# Patient Record
Sex: Male | Born: 1980 | Race: Black or African American | Hispanic: No | Marital: Single | Smoking: Current every day smoker
Health system: Southern US, Community
[De-identification: ages and names within clinical notes are randomized; demographics above are authoritative.]

## PROBLEM LIST (undated history)

## (undated) DIAGNOSIS — E119 Type 2 diabetes mellitus without complications: Secondary | ICD-10-CM

## (undated) DIAGNOSIS — G4733 Obstructive sleep apnea (adult) (pediatric): Secondary | ICD-10-CM

## (undated) DIAGNOSIS — Z9989 Dependence on other enabling machines and devices: Secondary | ICD-10-CM

## (undated) DIAGNOSIS — E1110 Type 2 diabetes mellitus with ketoacidosis without coma: Secondary | ICD-10-CM

## (undated) DIAGNOSIS — K219 Gastro-esophageal reflux disease without esophagitis: Secondary | ICD-10-CM

## (undated) HISTORY — DX: Dependence on other enabling machines and devices: Z99.89

## (undated) HISTORY — PX: FOOT SURGERY: SHX648

## (undated) HISTORY — DX: Type 2 diabetes mellitus without complications: E11.9

## (undated) HISTORY — DX: Obstructive sleep apnea (adult) (pediatric): G47.33

## (undated) HISTORY — DX: Type 2 diabetes mellitus with ketoacidosis without coma: E11.10

## (undated) HISTORY — PX: HERNIA REPAIR: SHX51

---

## 2002-10-03 ENCOUNTER — Emergency Department (HOSPITAL_COMMUNITY): Admission: EM | Admit: 2002-10-03 | Discharge: 2002-10-04 | Payer: Self-pay | Admitting: Emergency Medicine

## 2002-10-03 ENCOUNTER — Encounter: Payer: Self-pay | Admitting: Emergency Medicine

## 2010-02-03 ENCOUNTER — Emergency Department (HOSPITAL_COMMUNITY): Admission: EM | Admit: 2010-02-03 | Discharge: 2010-02-03 | Payer: Self-pay | Admitting: Emergency Medicine

## 2010-02-10 ENCOUNTER — Emergency Department (HOSPITAL_COMMUNITY): Admission: EM | Admit: 2010-02-10 | Discharge: 2010-02-10 | Payer: Self-pay | Admitting: Emergency Medicine

## 2010-07-09 LAB — URINALYSIS, ROUTINE W REFLEX MICROSCOPIC
Bilirubin Urine: NEGATIVE
Glucose, UA: NEGATIVE mg/dL
Hgb urine dipstick: NEGATIVE
Ketones, ur: NEGATIVE mg/dL
Nitrite: NEGATIVE
Protein, ur: NEGATIVE mg/dL
Specific Gravity, Urine: 1.018 (ref 1.005–1.030)
Urobilinogen, UA: 0.2 mg/dL (ref 0.0–1.0)
pH: 6 (ref 5.0–8.0)

## 2010-07-09 LAB — URINE MICROSCOPIC-ADD ON

## 2010-07-10 LAB — URINE CULTURE
Colony Count: 75000
Culture  Setup Time: 201110110437

## 2010-07-10 LAB — URINE MICROSCOPIC-ADD ON

## 2010-07-10 LAB — URINALYSIS, ROUTINE W REFLEX MICROSCOPIC
Glucose, UA: NEGATIVE mg/dL
Hgb urine dipstick: NEGATIVE
Ketones, ur: NEGATIVE mg/dL
Nitrite: NEGATIVE
Protein, ur: NEGATIVE mg/dL
Specific Gravity, Urine: 1.038 — ABNORMAL HIGH (ref 1.005–1.030)
Urobilinogen, UA: 1 mg/dL (ref 0.0–1.0)
pH: 6 (ref 5.0–8.0)

## 2012-03-21 ENCOUNTER — Emergency Department (HOSPITAL_COMMUNITY)
Admission: EM | Admit: 2012-03-21 | Discharge: 2012-03-21 | Disposition: A | Discharge status: Home / Self Care | Payer: Self-pay | Attending: Emergency Medicine | Admitting: Emergency Medicine

## 2012-03-21 ENCOUNTER — Encounter (HOSPITAL_COMMUNITY): Payer: Self-pay | Admitting: Emergency Medicine

## 2012-03-21 ENCOUNTER — Emergency Department (HOSPITAL_COMMUNITY): Payer: Self-pay

## 2012-03-21 DIAGNOSIS — R110 Nausea: Secondary | ICD-10-CM | POA: Insufficient documentation

## 2012-03-21 DIAGNOSIS — R209 Unspecified disturbances of skin sensation: Secondary | ICD-10-CM | POA: Insufficient documentation

## 2012-03-21 DIAGNOSIS — M2550 Pain in unspecified joint: Secondary | ICD-10-CM | POA: Insufficient documentation

## 2012-03-21 DIAGNOSIS — R0602 Shortness of breath: Secondary | ICD-10-CM | POA: Insufficient documentation

## 2012-03-21 DIAGNOSIS — M549 Dorsalgia, unspecified: Secondary | ICD-10-CM | POA: Insufficient documentation

## 2012-03-21 DIAGNOSIS — R1013 Epigastric pain: Secondary | ICD-10-CM | POA: Insufficient documentation

## 2012-03-21 DIAGNOSIS — R5383 Other fatigue: Secondary | ICD-10-CM | POA: Insufficient documentation

## 2012-03-21 DIAGNOSIS — F17200 Nicotine dependence, unspecified, uncomplicated: Secondary | ICD-10-CM | POA: Insufficient documentation

## 2012-03-21 DIAGNOSIS — R61 Generalized hyperhidrosis: Secondary | ICD-10-CM | POA: Insufficient documentation

## 2012-03-21 DIAGNOSIS — R5381 Other malaise: Secondary | ICD-10-CM | POA: Insufficient documentation

## 2012-03-21 DIAGNOSIS — R0789 Other chest pain: Secondary | ICD-10-CM | POA: Insufficient documentation

## 2012-03-21 LAB — BASIC METABOLIC PANEL
CO2: 25 mEq/L (ref 19–32)
Calcium: 9.1 mg/dL (ref 8.4–10.5)
Chloride: 100 mEq/L (ref 96–112)
Creatinine, Ser: 1.07 mg/dL (ref 0.50–1.35)
GFR calc Af Amer: >90 mL/min (ref >90)
Glucose, Bld: 194 mg/dL — ABNORMAL HIGH (ref 70–99)
Potassium: 4.2 mEq/L (ref 3.5–5.1)
Sodium: 137 mEq/L (ref 135–145)

## 2012-03-21 LAB — CBC WITH DIFFERENTIAL/PLATELET
Basophils Absolute: 0 10*3/uL (ref 0.0–0.1)
Basophils Relative: 0 % (ref 0–1)
Eosinophils Absolute: 0.1 10*3/uL (ref 0.0–0.7)
Hemoglobin: 15.5 g/dL (ref 13.0–17.0)
Lymphocytes Relative: 36 % (ref 12–46)
Lymphs Abs: 3 10*3/uL (ref 0.7–4.0)
MCH: 28.4 pg (ref 26.0–34.0)
MCHC: 33.2 g/dL (ref 30.0–36.0)
MCV: 85.5 fL (ref 78.0–100.0)
Monocytes Absolute: 0.4 10*3/uL (ref 0.1–1.0)
Neutro Abs: 4.8 10*3/uL (ref 1.7–7.7)
Neutrophils Relative %: 58 % (ref 43–77)
Platelets: 285 10*3/uL (ref 150–400)
RBC: 5.46 MIL/uL (ref 4.22–5.81)
RDW: 13.1 % (ref 11.5–15.5)
WBC: 8.3 10*3/uL (ref 4.0–10.5)

## 2012-03-21 LAB — POCT I-STAT TROPONIN I
Troponin i, poc: 0 ng/mL (ref 0.00–0.08)
Troponin i, poc: 0 ng/mL (ref 0.00–0.08)

## 2012-03-21 MED ORDER — IBUPROFEN 200 MG PO TABS
800.0000 mg | ORAL_TABLET | Freq: Once | ORAL | Status: AC
  Administered 2012-03-21: 800 mg via ORAL
  Filled 2012-03-21: qty 4

## 2012-03-21 MED ORDER — GI COCKTAIL ~~LOC~~
30.0000 mL | Freq: Once | ORAL | Status: AC
  Administered 2012-03-21: 30 mL via ORAL
  Filled 2012-03-21: qty 30

## 2012-03-21 NOTE — ED Notes (Signed)
Pt c/o midsternal CP with SOB; pt has pain worse with inspiration

## 2012-03-21 NOTE — ED Notes (Addendum)
Pt c/o 8/10 intermittent tightness to central chest onset 4 days ago. Pt took tylenol yesterday with no relief. Pt A&Ox4, ambulatory, VSS. Pt states the cp woke him up out of sleep last night, c/o diaphoresis. Pt states he recently has fallen asleep randomly at work, in the car, when talking to friends.

## 2012-03-21 NOTE — ED Provider Notes (Signed)
History     CSN: [id number]  Arrival date & time 03/21/12  1637   First MD Initiated Contact with Patient 03/21/12 1843      Chief Complaint  Patient presents with  . Chest Pain    (Consider location/radiation/quality/duration/timing/severity/associated sxs/prior treatment) Patient is a 31 y.o. male presenting with chest pain. The history is provided by the patient. No language interpreter was used.  Chest Pain The chest pain began 3 - 5 days ago. Chest pain occurs intermittently. The chest pain is worsening. At its most intense, the pain is at 8/10. The pain is currently at 0/10. The quality of the pain is described as pressure-like. The pain radiates to the left jaw. Primary symptoms include shortness of breath and nausea. Pertinent negatives for primary symptoms include no fever, no syncope, no cough, no abdominal pain, no vomiting and no dizziness.  Associated symptoms include diaphoresis and weakness.  Pertinent negatives for associated symptoms include no near-syncope and no numbness. Risk factors include smoking/tobacco exposure, male gender, lack of exercise and obesity.  His past medical history is significant for sleep apnea.  Pertinent negatives for past medical history include no aneurysm, no CHF, no DVT, no hypertension, no MI, no pacemaker, no PE and no seizures.  His family medical history is significant for diabetes in family, hyperlipidemia in family and hypertension in family.  Pertinent negatives for family medical history include: no early MI in family and no PE in family.  Procedure history is negative for cardiac catheterization, echocardiogram, persantine thallium, stress echo, stress thallium and exercise treadmill test.    31 year old male coming in with complaint of chest pain that started 4 days ago and is intermittent and lasts for minutes to hours. Multiple other complaints such as a head injury when he was young and states that since then he falls asleep  spontaneously.  For this reason he does not drive.  Also c/o numbness in his hands intermittantly.  States that last night the chest pain woke him up in the middle of his sleep. Says the pain is worse with exertion and better with rest. Describes the pain as tightness 7/10.  He took tylenol for pain with no relief.  Patient does not know if he has high cholesterol but he had a physical last year and he did not have high cholesterol, hypertension or diabetes.    Patient states that he ids a smoker.  No family hx of MI but CHF.  Also c/o chronic back pain.  He goes to [facility] when he has insurance.    History reviewed. No pertinent past medical history.  History reviewed. No pertinent past surgical history.  History reviewed. No pertinent family history.  History  Substance Use Topics  . Smoking status: Current Every Day Smoker  . Smokeless tobacco: Not on file  . Alcohol Use: No      Review of Systems  Constitutional: Positive for diaphoresis. Negative for fever.  HENT: Negative.   Eyes: Negative.   Respiratory: Positive for shortness of breath. Negative for cough.   Cardiovascular: Positive for chest pain. Negative for syncope and near-syncope.       R chest pain  Gastrointestinal: Positive for nausea. Negative for vomiting and abdominal pain.  Musculoskeletal: Positive for back pain and arthralgias. Negative for gait problem.  Neurological: Positive for weakness. Negative for dizziness, seizures and numbness.  Psychiatric/Behavioral: Negative.  Negative for confusion. The patient is not nervous/anxious.   All other systems reviewed and are negative.  Allergies  Review of patient's allergies indicates no known allergies.  Home Medications  No current outpatient prescriptions on file.  BP 125/83  Pulse 83  Resp 18  SpO2 99%  Physical Exam  Nursing note and vitals reviewed. Constitutional: He is oriented to person, place, and time. He appears well-developed and  well-nourished.  HENT:  Head: Normocephalic.  Eyes: Conjunctivae normal and EOM are normal. Pupils are equal, round, and reactive to light.  Neck: Normal range of motion. Neck supple.  Cardiovascular: Normal rate.   Pulmonary/Chest: Breath sounds normal. He is in respiratory distress.  Abdominal: Soft. Bowel sounds are normal. He exhibits no distension. There is no tenderness.  Musculoskeletal: Normal range of motion.  Neurological: He is alert and oriented to person, place, and time.  Skin: Skin is warm and dry.  Psychiatric: He has a normal mood and affect.    ED Course  Procedures (including critical care time)  Labs Reviewed  BASIC METABOLIC PANEL - Abnormal; Notable for the following:    Glucose, Bld 194 (*)     GFR calc non Af Amer 90 (*)     All other components within normal limits  CBC WITH DIFFERENTIAL  POCT I-STAT TROPONIN I   Dg Chest 2 View  03/21/2012  *RADIOLOGY REPORT*  Clinical Data: Right-sided chest pain and shortness of breath.  CHEST - 2 VIEW  Comparison: No priors.  Findings: Lung volumes are normal.  No consolidative airspace disease.  No pleural effusions.  No pneumothorax.  No pulmonary nodule or mass noted.  Pulmonary vasculature and the cardiomediastinal silhouette are within normal limits.  IMPRESSION: 1. No radiographic evidence of acute cardiopulmonary disease.   Original Report Authenticated By: Trudie Reed, M.D.      No diagnosis found.    MDM  Atypical chest pain to R sternal border x 4 days intermittant.  Better in ER after GI cocktail.  Reproducible.  Will follow up with cardiology tomorrow.  Also he will get a pcp to follow his blood sugars which were elevated in the ER.  Chest x-ray unremarkable and reviewed by myself.  Cardiac markers negative x 2.     Labs Reviewed  BASIC METABOLIC PANEL - Abnormal; Notable for the following:    Glucose, Bld 194 (*)     GFR calc non Af Amer 90 (*)     All other components within normal limits  CBC  WITH DIFFERENTIAL  POCT I-STAT TROPONIN I  POCT I-STAT TROPONIN I      Date: 03/21/2012  Rate: 91  Rhythm: normal sinus rhythm  QRS Axis: right  Intervals: normal  ST/T Wave abnormalities: normal  Conduction Disutrbances:none  Narrative Interpretation:   Old EKG Reviewed: none available      Remi Haggard, NP 03/21/12 2137

## 2012-03-23 NOTE — ED Provider Notes (Signed)
Medical screening examination/treatment/procedure(s) were conducted as a shared visit with non-physician practitioner(s) and myself.  I personally evaluated the patient during the encounter Pt with cp for past 3-4 days, constant. Chest wall tenderness. Ed workup neg.   Suzi Roots, MD 03/23/12 [phone]

## 2012-04-14 ENCOUNTER — Ambulatory Visit: Payer: Self-pay

## 2012-04-14 ENCOUNTER — Encounter: Payer: Self-pay | Admitting: Radiation Oncology

## 2012-04-14 ENCOUNTER — Ambulatory Visit (INDEPENDENT_AMBULATORY_CARE_PROVIDER_SITE_OTHER): Payer: Self-pay | Admitting: Radiation Oncology

## 2012-04-14 VITALS — BP 124/80 | HR 95 | Temp 98.5°F | Resp 20 | Ht 70.5 in | Wt 307.3 lb

## 2012-04-14 DIAGNOSIS — R799 Abnormal finding of blood chemistry, unspecified: Secondary | ICD-10-CM

## 2012-04-14 DIAGNOSIS — R7989 Other specified abnormal findings of blood chemistry: Secondary | ICD-10-CM

## 2012-04-14 DIAGNOSIS — Z79899 Other long term (current) drug therapy: Secondary | ICD-10-CM

## 2012-04-14 DIAGNOSIS — E119 Type 2 diabetes mellitus without complications: Secondary | ICD-10-CM

## 2012-04-14 DIAGNOSIS — R739 Hyperglycemia, unspecified: Secondary | ICD-10-CM

## 2012-04-14 LAB — BASIC METABOLIC PANEL
BUN: 15 mg/dL (ref 6–23)
CO2: 27 mEq/L (ref 19–32)
Calcium: 9.2 mg/dL (ref 8.4–10.5)
Chloride: 102 mEq/L (ref 96–112)
Creat: 1.39 mg/dL — ABNORMAL HIGH (ref 0.50–1.35)
Glucose, Bld: 78 mg/dL (ref 70–99)
Potassium: 4.2 mEq/L (ref 3.5–5.3)
Sodium: 138 mEq/L (ref 135–145)

## 2012-04-14 LAB — POCT GLYCOSYLATED HEMOGLOBIN (HGB A1C): Hemoglobin A1C: 7.3

## 2012-04-14 LAB — GLUCOSE, CAPILLARY: Glucose-Capillary: 78 mg/dL (ref 70–99)

## 2012-04-14 MED ORDER — METFORMIN HCL 500 MG PO TABS: ORAL_TABLET | ORAL | Status: DC

## 2012-04-14 NOTE — Patient Instructions (Signed)
Type 2 Diabetes       Diabetes is a long-lasting (chronic) disease. One or both of the following happen with type 2 diabetes:   The pancreas does not make enough of a hormone called insulin.   The body has trouble using the insulin that is made.  HOME CARE   Check your blood sugar (glucose) once a day, or as told by your doctor.   Take all medicine as told by your doctor.   Do not smoke.   Eat healthy foods. Weight loss can help your diabetes.   Learn about low blood sugar (hypoglycemia). Know how to treat it.   Get your eyes checked on a regular basis.   Get a physical exam every year. Get your blood pressure checked. Get your blood and pee (urine) tested.   Wear a necklace or bracelet that says you have diabetes.   Check your feet every night for cuts, sores, blisters, and redness. Tell your doctor if you have problems.  GET HELP RIGHT AWAY IF:   You have trouble keeping your blood sugar in target range.   You have problems with your medicines.   You are sick and not getting better after 24 hours.   You have a sore or wound that is not healing.   You have vision problems or changes.   You have a fever.  MAKE SURE YOU:   Understand these instructions.   Will watch your condition.   Will get help right away if you are not doing well or get worse.  Document Released: 01/21/2008 Document Revised: 07/06/2011 Document Reviewed: 09/29/2010   [employer]® Patient Information ©2013 [employer].

## 2012-04-15 DIAGNOSIS — E119 Type 2 diabetes mellitus without complications: Secondary | ICD-10-CM | POA: Insufficient documentation

## 2012-04-15 DIAGNOSIS — R7989 Other specified abnormal findings of blood chemistry: Secondary | ICD-10-CM | POA: Insufficient documentation

## 2012-04-15 DIAGNOSIS — IMO0002 Reserved for concepts with insufficient information to code with codable children: Secondary | ICD-10-CM | POA: Insufficient documentation

## 2012-04-15 NOTE — Assessment & Plan Note (Addendum)
Pt has diabetes mellitus, as evidenced by his multiple elevated CBGs and his A1c today. He will start metformin at this time, which will be titrated up over one month. He will return to clinic in approximately one month for followup and modification of his medication regimen as necessary. - metformin 500mg  qAM titrated upwards to 1000mg  bid over 4 wks (see rx) - f/u in 1 month - f/u with diabetic educator to discuss lifestyle changes and weight reduction  Lab Results  Component Value Date   HGBA1C 7.3 04/14/2012

## 2012-04-15 NOTE — Progress Notes (Signed)
Subjective:    Patient ID: Brady Gibbs, male    DOB: 1980/09/12, 31 y.o.   MRN: [id number]  HPI This is a 31 year old man with no reported past medical history who presents to clinic today for evaluation of hyperglycemia, which was noted on a recent ED visit for chest pain. The patient states that he has been feeling tired and fatigued for approximately one year, and also complains of polyuria and polydipsia for that same period of time. He also states he believes he's gained approximately 60 pounds over the last year.   He denies having any further chest pain following his recent ED visit, at which time patient had a negative workup for cardiac causes of chest pain, and had chest wall tenderness to palpation. He complains of similar aches and pains in his right upper back, and of occasional mild bilateral cramping abdominal pain with bowel movements. He also complains of cramps in his bilateral thighs. 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 He denies any other complaints today.    Review of Systems  Current Outpatient Medications: Current Outpatient Prescriptions  Medication Sig Dispense Refill  . acetaminophen (TYLENOL) 500 MG tablet Take 1,000 mg by mouth every 6 (six) hours as needed. For pain      . metFORMIN (GLUCOPHAGE) 500 MG tablet 1 tab PO qAM x 7d, then 1 tab bid x 7d, then 2 tab qAM and 1 tab qPM x 7d, then 2 tabs bid x 7d  60 tablet  0    Allergies: No Known Allergies   Past Medical History: No past medical history on file.  Past Surgical History: No past surgical history on file.  Family History: No family history on file.  Social History: History   Social History  . Marital Status: Married    Spouse Name: N/A    Number of Children: N/A  . Years of Education: N/A   Occupational History  . Not on file.   Social History Main Topics  . Smoking  status: Current Every Day Smoker -- 0.5 packs/day    Types: Cigarettes  . Smokeless tobacco: Not on file     Comment: Quit line  . Alcohol Use: No  . Drug Use: No  . Sexually Active: Not on file   Other Topics Concern  . Not on file   Social History Narrative  . No narrative on file     Vital Signs: Blood pressure 124/80, pulse 95, temperature 98.5 F (36.9 C), temperature source Oral, resp. rate 20, height 5' 10.5" (1.791 m), weight 307 lb 4.8 oz (139.39 kg), SpO2 96.00%.      Objective:   Physical Exam  Constitutional: He is oriented to person, place, and time. He appears well-developed and well-nourished. No distress.  HENT:  Head: Normocephalic and atraumatic.  Eyes: Pupils are equal, round, and reactive to light. No scleral icterus.  Neck: Normal range of motion. Neck supple. No tracheal deviation present.  Cardiovascular: Normal rate and regular rhythm.   No murmur heard. Pulmonary/Chest: Effort normal. He has no wheezes. He has no rales.  Abdominal: Soft. Bowel sounds are normal. He exhibits no distension. There is no tenderness.  Musculoskeletal: Normal range of motion. He exhibits no edema.       Arms: Neurological: He is alert and oriented to person, place, and time.  Skin: Skin is warm and dry. No erythema.  Psychiatric: He has a normal mood and affect. His behavior is normal.          Assessment & Plan:

## 2012-04-15 NOTE — Assessment & Plan Note (Signed)
BMET results (obtained after visit) reveal pt's creatinine is somewhat increased since his ED visit. Suspect this is 2/2 to patient's new job which requires significant exertion, however he will need to RTC for repeat BMET in 1wk given that he was started on metformin today.  - repeat BMET in 1 wk   BMET    Component Value Date/Time   NA 138 04/14/2012 1637   K 4.2 04/14/2012 1637   CL 102 04/14/2012 1637   CO2 27 04/14/2012 1637   GLUCOSE 78 04/14/2012 1637   BUN 15 04/14/2012 1637   CREATININE 1.39* 04/14/2012 1637   CREATININE 1.07 03/21/2012 1658   CALCIUM 9.2 04/14/2012 1637   GFRNONAA 90* 03/21/2012 1658   GFRAA >90 03/21/2012 1658

## 2012-04-22 ENCOUNTER — Encounter: Payer: Self-pay | Admitting: Radiation Oncology

## 2012-04-25 ENCOUNTER — Encounter: Payer: Self-pay | Admitting: Radiation Oncology

## 2012-05-11 ENCOUNTER — Other Ambulatory Visit (INDEPENDENT_AMBULATORY_CARE_PROVIDER_SITE_OTHER): Payer: Self-pay

## 2012-05-11 DIAGNOSIS — R799 Abnormal finding of blood chemistry, unspecified: Secondary | ICD-10-CM

## 2012-05-11 LAB — BASIC METABOLIC PANEL
BUN: 13 mg/dL (ref 6–23)
CO2: 26 mEq/L (ref 19–32)
Calcium: 9.2 mg/dL (ref 8.4–10.5)
Chloride: 103 mEq/L (ref 96–112)
Creat: 1.27 mg/dL (ref 0.50–1.35)
Glucose, Bld: 169 mg/dL — ABNORMAL HIGH (ref 70–99)
Potassium: 4 mEq/L (ref 3.5–5.3)
Sodium: 139 mEq/L (ref 135–145)

## 2012-05-18 ENCOUNTER — Encounter: Payer: Self-pay | Admitting: Dietician

## 2012-05-18 ENCOUNTER — Ambulatory Visit: Payer: Self-pay

## 2012-05-19 ENCOUNTER — Encounter: Payer: Self-pay | Admitting: Dietician

## 2012-05-19 ENCOUNTER — Ambulatory Visit (INDEPENDENT_AMBULATORY_CARE_PROVIDER_SITE_OTHER): Payer: Self-pay | Admitting: Dietician

## 2012-05-19 ENCOUNTER — Encounter: Payer: Self-pay | Admitting: Internal Medicine

## 2012-05-19 VITALS — BP 130/90 | Ht 72.0 in | Wt 312.1 lb

## 2012-05-19 DIAGNOSIS — E119 Type 2 diabetes mellitus without complications: Secondary | ICD-10-CM

## 2012-05-19 NOTE — Progress Notes (Signed)
Diabetes Self-Management Training (DSMT)  Initial Visit  05/19/2012 Mr. Brady Gibbs, identified by name and date of birth, is a 32 y.o. male with Type 2 Diabetes. Year of diabetes diagnosis: 04/2012  ASSESSMENT Patient concerns are Nutrition/meal planning, Monitoring, Healthy Lifestyle, Glycemic control and Weight control.  Blood pressure 130/90, height 6' (1.829 m), weight 312 lb 1.6 oz (141.568 kg). Body mass index is 42.33 kg/(m^2). No results found for this basename: [facility]   Lab Results  Component Value Date   HGBA1C 7.3 04/14/2012   Family history of diabetes: Yes Patients belief/attitude about diabetes: Diabetes can be controlled. Self foot exams daily: No Diabetes Complications: {DM COMPLICATIONS:2029 Support systems: family- mother and brother with whom he lives Special needs: None Prior DM Education: No   Medications See Medications list.  Is interested in learning more   Exercise Plan Doing ADLs and walking for 30 minutesa day.   Self-Monitoring Waiting on orange card to get meter from Health department Lunch: 74 here in office after skipping lunch and eating muffin and egg sandwich for breakfast with water Hyperglycemia: Yes Rarely Hypoglycemia: No   Meal Planning Some knowledge and Interested in improving   Assessment comments: patient caught in very quickly to meal planning- has good base knowledge of nutrition. Motivated to learn.    INDIVIDUAL DIABETES EDUCATION PLAN:  Diabetes disease state Nutrition management Physical activity and exercise Medication Monitoring Acute complication: Chronic complications Goal setting _______________________________________________________________________  Intervention TOPICS COVERED TODAY:  Nutrition management  Role of diet in the treatment of diabetes and the relationship between the three main macronutritents and blood glucose control. Monitoring  Purpose and frequency of SMBG. Goal setting  Lifestyle  issues that need to be addressed for better diabetes care Review risk of smoking and offered smoking cessation  PATIENTS GOALS/PLAN (copy and paste in patient instructions so patient receives a copy): 1.  Learning Objective:       State important issues of diabetes care 2.  Behavioral Objective:         Nutrition: To improve blood glucose control I will follow meal plan of 4-6 servings 60-90 grams carb/meal and ~ 30 for snacks Never 0%  Personalized Follow-Up Plan for Ongoing Self Management Support:  Doctor's Office, family and CDE visits ______________________________________________________________________   Outcomes Expected outcomes: Demonstrated interest in learning.Expect positive changes in lifestyle. Self-care Barriers: Lack of material resources, Coping skills Education material provided: yes- living well with diabetes Patient to contact team via Phone if problems or questions. Time in: 1500     Time out: 1600 Future DSMT - 3-4 wks or sooner if patient desires- he agreed to come back once he gets his beter and also to follow up on meal planning reassessment of needs  Bunnie Lederman, Lupita Leash

## 2012-05-23 ENCOUNTER — Encounter: Payer: Self-pay | Admitting: Internal Medicine

## 2012-05-23 ENCOUNTER — Encounter: Payer: Self-pay | Admitting: Dietician

## 2012-05-23 ENCOUNTER — Ambulatory Visit (INDEPENDENT_AMBULATORY_CARE_PROVIDER_SITE_OTHER): Payer: Self-pay | Admitting: Internal Medicine

## 2012-05-23 VITALS — BP 132/98 | HR 104 | Temp 97.8°F | Ht 70.0 in | Wt 312.4 lb

## 2012-05-23 DIAGNOSIS — E119 Type 2 diabetes mellitus without complications: Secondary | ICD-10-CM

## 2012-05-23 DIAGNOSIS — R358 Other polyuria: Secondary | ICD-10-CM

## 2012-05-23 DIAGNOSIS — N390 Urinary tract infection, site not specified: Secondary | ICD-10-CM

## 2012-05-23 LAB — BASIC METABOLIC PANEL
CO2: 23 mEq/L (ref 19–32)
Chloride: 104 mEq/L (ref 96–112)
Creat: 1.17 mg/dL (ref 0.50–1.35)
Glucose, Bld: 138 mg/dL — ABNORMAL HIGH (ref 70–99)

## 2012-05-23 LAB — GLUCOSE, CAPILLARY: Glucose-Capillary: 173 mg/dL — ABNORMAL HIGH (ref 70–99)

## 2012-05-23 MED ORDER — SULFAMETHOXAZOLE-TRIMETHOPRIM 800-160 MG PO TABS: 1.0000 | ORAL_TABLET | Freq: Two times a day (BID) | ORAL | Status: AC

## 2012-05-23 NOTE — Progress Notes (Signed)
  Subjective:   Patient ID: Brady Gibbs male   DOB: 15-Oct-1980 32 y.o.   MRN: [id number]  HPI: Brady Gibbs is a 31 y.o. man pmh DM type 2 presenting to clinic for acute symptoms of polyuria. Patient has had symptoms of polyuria and polydipsia for about the past 2-3 days. No associated fever or chills, back pain, abdominal pain, discharge, new sexual partners, or hematuria. She has not uptitrated his metformin yet at this time and is only taking 500 mg daily for fear that increasing would increase her potential cause some abdominal pain. Patient describes some sort of neonatal surgery regarding his abdomen and has a transverse scar. Patient has had UTIs in the past once about 2 years ago that required treatment and is unsure whether he had as a child. Patient has had no sexual contact for about 2 years, no associated scrotal pain, or testicular masses. The patient did describe a sudden direction of a singular lesion on his right scrotum.    No past medical history on file. Current Outpatient Prescriptions  Medication Sig Dispense Refill  . acetaminophen (TYLENOL) 500 MG tablet Take 1,000 mg by mouth every 6 (six) hours as needed. For pain      . metFORMIN (GLUCOPHAGE) 500 MG tablet 1 tab PO qAM x 7d, then 1 tab bid x 7d, then 2 tab qAM and 1 tab qPM x 7d, then 2 tabs bid x 7d  60 tablet  0  . sulfamethoxazole-trimethoprim (BACTRIM DS) 800-160 MG per tablet Take 1 tablet by mouth 2 (two) times daily.  28 tablet  0   No family history on file. History   Social History  . Marital Status: Married    Spouse Name: N/A    Number of Children: N/A  . Years of Education: N/A   Social History Main Topics  . Smoking status: Current Every Day Smoker -- 0.5 packs/day    Types: Cigarettes  . Smokeless tobacco: None     Comment: Quit line  . Alcohol Use: No  . Drug Use: No  . Sexually Active: None   Other Topics Concern  . None   Social History Narrative  . None   Review of Systems: otherwise  negative unless listed in HPI  Objective:  Physical Exam: Filed Vitals:   05/23/12 1442  BP: 132/98  Pulse: 104  Temp: 97.8 F (36.6 C)  TempSrc: Oral  Height: 5\' 10"  (1.778 m)  Weight: 312 lb 6.4 oz (141.704 kg)  SpO2: 96%   General: NAD HEENT: PERRL, EOMI, no scleral icterus Cardiac: RRR, no rubs, murmurs or gallops Pulm: clear to auscultation bilaterally, moving normal volumes of air Abd: soft, nontender, nondistended, BS present Ext: warm and well perfused, no pedal edema, penis circumcised, singular lesion on right scrotum pedunculated non-erythmeatous, nontender, no testicular masses, both testicles of equal size and shape, no LAD, no expressed penile discharge Neuro: alert and oriented X3, cranial nerves II-XII grossly intact  Assessment & Plan:  1. Complicated UTI: Patient UA positive for RBCs and leukocytes but nitrate negative. Patient has had UTI in the past and described polyuria. Some concern for anatomical pathology given neonatal surgery and second symptomatic UTI. -UA -UCx -referral to urology -bactrim DS for 14 days -Bmet to check Cr as was previously elevated  2. DM: Patient new diagnosed diabetic hemoglobin A1c 7.3 -increase metformin 500mg  BID -cont diabetes education  Pt discussed with Dr. Criselda Peaches

## 2012-05-23 NOTE — Patient Instructions (Signed)
Was nice to meet you today.  You do have a urinary tract infection and will be treating that with an antibiotic called Bactrim DS please take it 2 times a day for 14 days and complete all of the we also made a referral for you to see a urologist since this is your second symptomatic infection.  In terms of your diabetes please continue to increase the metformin as written on your prescription and followup in one month to see how you're doing. Continue seeing diabetes educator.  Do not hesitate to contact our clinic with any more questions

## 2012-05-24 LAB — URINALYSIS, ROUTINE W REFLEX MICROSCOPIC
Hgb urine dipstick: NEGATIVE
Ketones, ur: NEGATIVE mg/dL
Nitrite: NEGATIVE
Protein, ur: NEGATIVE mg/dL
Specific Gravity, Urine: >1.03 — ABNORMAL HIGH (ref 1.005–1.030)
Urobilinogen, UA: 0.2 mg/dL (ref 0.0–1.0)

## 2012-05-24 LAB — URINALYSIS, MICROSCOPIC ONLY
Bacteria, UA: NONE SEEN
Casts: NONE SEEN

## 2012-05-24 LAB — URINE CULTURE: Colony Count: NO GROWTH

## 2012-06-14 ENCOUNTER — Ambulatory Visit (INDEPENDENT_AMBULATORY_CARE_PROVIDER_SITE_OTHER): Payer: No Typology Code available for payment source | Admitting: Radiation Oncology

## 2012-06-14 ENCOUNTER — Encounter: Payer: Self-pay | Admitting: Radiation Oncology

## 2012-06-14 VITALS — BP 138/88 | HR 104 | Temp 98.0°F | Ht 71.0 in | Wt 314.5 lb

## 2012-06-14 DIAGNOSIS — G4710 Hypersomnia, unspecified: Secondary | ICD-10-CM

## 2012-06-14 DIAGNOSIS — E119 Type 2 diabetes mellitus without complications: Secondary | ICD-10-CM

## 2012-06-14 DIAGNOSIS — R400 Somnolence: Secondary | ICD-10-CM

## 2012-06-14 DIAGNOSIS — Z87440 Personal history of urinary (tract) infections: Secondary | ICD-10-CM

## 2012-06-14 LAB — GLUCOSE, CAPILLARY: Glucose-Capillary: 237 mg/dL — ABNORMAL HIGH (ref 70–99)

## 2012-06-14 MED ORDER — METFORMIN HCL 500 MG PO TABS: 1000.0000 mg | ORAL_TABLET | Freq: Two times a day (BID) | ORAL | Status: DC

## 2012-06-14 NOTE — Patient Instructions (Addendum)
Sleep Apnea  Sleep apnea is a sleep disorder characterized by abnormal pauses in breathing while you sleep. When your breathing pauses, the level of oxygen in your blood decreases. This causes you to move out of deep sleep and into light sleep. As a result, your quality of sleep is poor, and the system that carries your blood throughout your body (cardiovascular system) experiences stress. If sleep apnea remains untreated, the following conditions can develop:  High blood pressure (hypertension).  Coronary artery disease.  Inability to achieve or maintain an erection (impotence).  Impairment of your thought process (cognitive dysfunction). There are three types of sleep apnea: 1. Obstructive sleep apnea Pauses in breathing during sleep because of a blocked airway. 2. Central sleep apnea Pauses in breathing during sleep because the area of the brain that controls your breathing does not send the correct signals to the muscles that control breathing. 3. Mixed sleep apnea A combination of both obstructive and central sleep apnea. RISK FACTORS The following risk factors can increase your risk of developing sleep apnea:  Being overweight.  Smoking.  Having narrow passages in your nose and throat.  Being of older age.  Being male.  Alcohol use.  Sedative and tranquilizer use.  Ethnicity. Among individuals younger than 35 years, African Americans are at increased risk of sleep apnea. SYMPTOMS   Difficulty staying asleep.  Daytime sleepiness and fatigue.  Loss of energy.  Irritability.  Loud, heavy snoring.  Morning headaches.  Trouble concentrating.  Forgetfulness.  Decreased interest in sex. DIAGNOSIS  In order to diagnose sleep apnea, your caregiver will perform a physical examination. Your caregiver may suggest that you take a home sleep test. Your caregiver may also recommend that you spend the night in a sleep lab. In the sleep lab, several monitors record  information about your heart, lungs, and brain while you sleep. Your leg and arm movements and blood oxygen level are also recorded. TREATMENT The following actions may help to resolve mild sleep apnea:  Sleeping on your side.   Using a decongestant if you have nasal congestion.   Avoiding the use of depressants, including alcohol, sedatives, and narcotics.   Losing weight and modifying your diet if you are overweight. There also are devices and treatments to help open your airway:  Oral appliances. These are custom-made mouthpieces that shift your lower jaw forward and slightly open your bite. This opens your airway.  Devices that create positive airway pressure. This positive pressure "splints" your airway open to help you breathe better during sleep. The following devices create positive airway pressure:  Continuous positive airway pressure (CPAP) device. The CPAP device creates a continuous level of air pressure with an air pump. The air is delivered to your airway through a mask while you sleep. This continuous pressure keeps your airway open.  Nasal expiratory positive airway pressure (EPAP) device. The EPAP device creates positive air pressure as you exhale. The device consists of single-use valves, which are inserted into each nostril and held in place by adhesive. The valves create very little resistance when you inhale but create much more resistance when you exhale. That increased resistance creates the positive airway pressure. This positive pressure while you exhale keeps your airway open, making it easier to breath when you inhale again.  Bilevel positive airway pressure (BPAP) device. The BPAP device is used mainly in patients with central sleep apnea. This device is similar to the CPAP device because it also uses an air pump to deliver  continuous air pressure through a mask. However, with the BPAP machine, the pressure is set at two different levels. The pressure when you  exhale is lower than the pressure when you inhale.  Surgery. Typically, surgery is only done if you cannot comply with less invasive treatments or if the less invasive treatments do not improve your condition. Surgery involves removing excess tissue in your airway to create a wider passage way. Document Released: 04/03/2002 Document Revised: 10/13/2011 Document Reviewed: 08/20/2011 [facility] Patient Information 2013 [place], [place].

## 2012-06-15 ENCOUNTER — Encounter: Payer: Self-pay | Admitting: Radiation Oncology

## 2012-06-15 DIAGNOSIS — Z87440 Personal history of urinary (tract) infections: Secondary | ICD-10-CM | POA: Insufficient documentation

## 2012-06-15 DIAGNOSIS — G4733 Obstructive sleep apnea (adult) (pediatric): Secondary | ICD-10-CM | POA: Insufficient documentation

## 2012-06-15 LAB — MICROALBUMIN / CREATININE URINE RATIO: Microalb Creat Ratio: 12.2 mg/g (ref 0.0–30.0)

## 2012-06-15 NOTE — Assessment & Plan Note (Signed)
Final UA results from 05/23/2012 visit are not highly concerning for UTI, particularly as urine cultures received no growth. From the Epic records, it is unclear if the patient has ever had a UTI in the past (UAs in the past have been relatively benign). No urology referral to be made at this time, however will consider this option if patient has findings consistent with UTI in the future, as this would indeed be a concerning finding for him given his male gender and overall lack of identifiable predisposing risk factors.

## 2012-06-15 NOTE — Assessment & Plan Note (Signed)
The patient has been taking his metformin at a lower dose than prescribed (1000mg  AM and 500mg  PM versus 1000mg  bid as prescribed). He was instructed to take metformin 1000mg  bid going forward.  - RTC to recheck A1c (pt unfortunately left the office before lab was drawn) => will determine appropriate f/u interval depending on result

## 2012-06-15 NOTE — Assessment & Plan Note (Signed)
Patient's complaints of daytime somnolence and witnessed apneic episodes during sleep are consistent with OSA and warrant further investigation. - nocturnal polysomnography

## 2012-06-15 NOTE — Progress Notes (Signed)
Subjective:    Patient ID: Brady Gibbs, male    DOB: [birth year], 32 y.o.   MRN: [id number]  HPI Patient is a 32 yo man with PMH significant for newly diagnosed type 2 diabetes mellitus in 03/2012. He is here today for a follow-up visit after starting oral antihyperglycemic therapy. He states he has been taking metformin 1000mg  in the AM and 500mg  in the PM rather than 1000mg  bid (prescribed dose) as he was afraid he would run out of this medication prior to today's visit. Since his initial visit in 03/2012 when he was diagnosed with DM, he was seen acutely in the [facility] for complaints of back/flank pain on 05/23/2012. Urine dipstick had revealed Hgb and leukocytes, and the patient was treated for complicated UTI with a 14-day antibiotic course. He states he completed these antibiotics, and denies any recent dysuria or hematuria.    The patient also complains of apneic episodes during sleep, as witnessed by his girlfriend, and also of excessive daytime somnolence. He states he his father had OSA and required CPAP.    Review of Systems  All other systems reviewed and are negative.   Current Outpatient Medications: Current Outpatient Prescriptions  Medication Sig Dispense Refill  . acetaminophen (TYLENOL) 500 MG tablet Take 1,000 mg by mouth every 6 (six) hours as needed. For pain      . metFORMIN (GLUCOPHAGE) 500 MG tablet 1 tab PO qAM x 7d, then 1 tab bid x 7d, then 2 tab qAM and 1 tab qPM x 7d, then 2 tabs bid x 7d  60 tablet  0  . metFORMIN (GLUCOPHAGE) 500 MG tablet Take 2 tablets (1,000 mg total) by mouth 2 (two) times daily with a meal.  120 tablet  5   No current facility-administered medications for this visit.    Allergies: No Known Allergies   Past Medical History: Type 2 Diabetes Mellitus  Past Surgical History: No past surgical history on file.  Family History: No family history on file.  Social History: History   Social History  . Marital Status: Married    Spouse  Name: N/A    Number of Children: N/A  . Years of Education: N/A   Occupational History  . Not on file.   Social History Main Topics  . Smoking status: Current Every Day Smoker -- 0.50 packs/day    Types: Cigarettes  . Smokeless tobacco: Not on file     Comment: Quit line  . Alcohol Use: No  . Drug Use: No  . Sexually Active: Not on file   Other Topics Concern  . Not on file   Social History Narrative  . No narrative on file     Vital Signs: Blood pressure 138/88, pulse 104, temperature 98 F (36.7 C), temperature source Oral, height 5\' 11"  (1.803 m), weight 314 lb 8 oz (142.656 kg), SpO2 96.00%.       Objective:   Physical Exam  Constitutional: He is oriented to person, place, and time. He appears well-developed and well-nourished. No distress.  HENT:  Head: Normocephalic and atraumatic.  Eyes: Conjunctivae are normal. Pupils are equal, round, and reactive to light. No scleral icterus.  Neck: Normal range of motion. Neck supple. No tracheal deviation present.  Cardiovascular: Normal rate and regular rhythm.   No murmur heard. 2+ DP pulses bilaterally  Pulmonary/Chest: Effort normal. He has no wheezes. He has no rales.  Abdominal: Bowel sounds are normal. He exhibits no distension. There is no tenderness.  Musculoskeletal: Normal  range of motion. He exhibits no edema.  Neurological: He is alert and oriented to person, place, and time. No cranial nerve deficit.  Skin: Skin is warm and dry. No erythema.  Psychiatric: He has a normal mood and affect. His behavior is normal.          Assessment & Plan:

## 2012-06-20 ENCOUNTER — Other Ambulatory Visit: Payer: Self-pay | Admitting: *Deleted

## 2012-06-20 NOTE — Telephone Encounter (Signed)
Called metformin to [employer] [facility]

## 2012-06-21 ENCOUNTER — Ambulatory Visit (INDEPENDENT_AMBULATORY_CARE_PROVIDER_SITE_OTHER): Payer: No Typology Code available for payment source | Admitting: Radiation Oncology

## 2012-06-21 DIAGNOSIS — R358 Other polyuria: Secondary | ICD-10-CM

## 2012-07-18 ENCOUNTER — Ambulatory Visit (HOSPITAL_BASED_OUTPATIENT_CLINIC_OR_DEPARTMENT_OTHER): Payer: No Typology Code available for payment source | Attending: Radiation Oncology | Admitting: Radiology

## 2012-07-18 VITALS — Ht 71.0 in | Wt 314.0 lb

## 2012-07-18 DIAGNOSIS — G4733 Obstructive sleep apnea (adult) (pediatric): Secondary | ICD-10-CM | POA: Insufficient documentation

## 2012-07-22 ENCOUNTER — Encounter: Payer: Self-pay | Admitting: Radiation Oncology

## 2012-07-22 NOTE — Addendum Note (Signed)
Addended by: Bufford Spikes on: 07/22/2012 12:03 PM   Modules accepted: Orders

## 2012-07-22 NOTE — Progress Notes (Signed)
Patient ID: Brady Gibbs, male   DOB: Aug 18, 1980, 32 y.o.   MRN: [id number] Placed order for renal US per urology referral.

## 2012-07-23 DIAGNOSIS — R0609 Other forms of dyspnea: Secondary | ICD-10-CM

## 2012-07-23 DIAGNOSIS — R0989 Other specified symptoms and signs involving the circulatory and respiratory systems: Secondary | ICD-10-CM

## 2012-07-23 DIAGNOSIS — G4733 Obstructive sleep apnea (adult) (pediatric): Secondary | ICD-10-CM

## 2012-07-23 NOTE — Procedures (Signed)
NAME:  Brady Gibbs, Brady Gibbs                 ACCOUNT NO.:  [id number]  MEDICAL RECORD NO.:  [id number]          PATIENT TYPE:  OUT  LOCATION:  SLEEP CENTER                 FACILITY:  [facility]  PHYSICIAN:  Clinton D. Maple Hudson, MD, FCCP, FACPDATE OF BIRTH:  Nov 13, 1978  DATE OF STUDY:  07/18/2012                           NOCTURNAL POLYSOMNOGRAM  REFERRING PHYSICIAN:  EMORY R MCTYRE  INDICATION FOR STUDY:  Hypersomnia with sleep apnea.  EPWORTH SLEEPINESS SCORE:  22/24.  BMI 43.8, weight 314 pounds, height 71 inches, neck 20 inches.  MEDICATIONS:  Home medications are charted and reviewed.  SLEEP ARCHITECTURE:  Total sleep time 202.5 minutes with sleep efficiency 57.5%.  Stage I was 7.4%, stage II 80.5%, stage III 0.2%, REM 11.9% of total sleep time.  Sleep latency 56.5 minutes, REM latency 46 minutes.  Awake after sleep onset 13 minutes.  Arousal index 16.9.  Bedtime Medication:  None.  RESPIRATORY DATA:  Apnea hypotony index (AHI) 61.9 per hour.  A total of 208 events were scored including 157 obstructive apneas, 1 central apnea, 10 mixed apneas, 40 hypopneas.  Events were associated with nonsupine sleep.  REM/AHI 40 per hour.  This was a diagnostic NPSG protocol as requested.  CPAP was not done.  OXYGEN DATA:  Moderate-to-loud snoring with oxygen desaturation to a nadir of 85% and mean oxygen saturation through the study of 93.5% on room air.  CARDIAC DATA:  Normal sinus rhythm.  MOVEMENT-PARASOMNIA:  No significant movement disturbance. Bathroom x2.  IMPRESSIONS-RECOMMENDATIONS: 1. Sleep architecture was significant for sleep onset at 11:30 p.m.     and spontaneous awakening to end the study at 3 a.m.  No bedtime     medication was taken. 2. Severe obstructive sleep apnea/hypopnea syndrome, AHI 61.9 per     hour.  Nonsupine events.  Moderate-to-loud snoring with oxygen     desaturation to a nadir of 85% and mean oxygen saturation through     the study of 93.5% on room air. 3. The  study was ordered as a diagnostic NPSG protocol.  Consider     return for dedicated CPAP titration study or evaluate for     alternative management as clinically appropriate.     Clinton D. Maple Hudson, MD, [facility], FACP Diplomate, American Board of Sleep Medicine    CDY/MEDQ  D:  07/23/2012 10:08:32  T:  07/23/2012 13:48:41  Job:  [id number]

## 2012-07-29 ENCOUNTER — Ambulatory Visit (INDEPENDENT_AMBULATORY_CARE_PROVIDER_SITE_OTHER): Payer: No Typology Code available for payment source | Admitting: Radiation Oncology

## 2012-07-29 ENCOUNTER — Ambulatory Visit (HOSPITAL_COMMUNITY)
Admission: RE | Admit: 2012-07-29 | Discharge: 2012-07-29 | Disposition: A | Discharge status: Home / Self Care | Payer: No Typology Code available for payment source | Source: Ambulatory Visit | Attending: Internal Medicine | Admitting: Internal Medicine

## 2012-07-29 ENCOUNTER — Encounter: Payer: Self-pay | Admitting: Radiation Oncology

## 2012-07-29 VITALS — BP 124/88 | HR 89 | Temp 96.9°F | Ht 70.0 in | Wt 313.5 lb

## 2012-07-29 DIAGNOSIS — K7689 Other specified diseases of liver: Secondary | ICD-10-CM | POA: Insufficient documentation

## 2012-07-29 DIAGNOSIS — G4733 Obstructive sleep apnea (adult) (pediatric): Secondary | ICD-10-CM

## 2012-07-29 DIAGNOSIS — R3589 Other polyuria: Secondary | ICD-10-CM | POA: Insufficient documentation

## 2012-07-29 DIAGNOSIS — R358 Other polyuria: Secondary | ICD-10-CM | POA: Insufficient documentation

## 2012-07-29 DIAGNOSIS — E119 Type 2 diabetes mellitus without complications: Secondary | ICD-10-CM

## 2012-07-29 LAB — POCT GLYCOSYLATED HEMOGLOBIN (HGB A1C): Hemoglobin A1C: 8.2

## 2012-07-29 MED ORDER — GLIPIZIDE ER 2.5 MG PO TB24: 2.5000 mg | ORAL_TABLET | Freq: Every day | ORAL | Status: DC

## 2012-07-29 NOTE — Progress Notes (Signed)
Subjective:    Patient ID: Brady Gibbs, male    DOB: Mar 11, 1981, 32 y.o.   MRN: [id number]  HPI Patient is a 32 year old man with PMH significant for type 2 diabetes mellitus who presents today for a routine followup visit. He continues to complain of daytime somnolence and multiple nighttime awakenings. He recently underwent a sleep study which revealed severe OSA.  DM - the patient admits to recent dietary indiscretions, and states he has been drinking sodas regularly for the last few months, after having previously discontinued drinking these beverages. He states he will try harder to control his diet, given that his glycemic control is worsened today (A1c = 8.2). He adamantly maintains that he has been fully compliant with taking metformin 1000 mg twice a day and has not missed any doses of this medication.     Review of Systems  All other systems reviewed and are negative.   Current Outpatient Medications: Current Outpatient Prescriptions  Medication Sig Dispense Refill  . acetaminophen (TYLENOL) 500 MG tablet Take 1,000 mg by mouth every 6 (six) hours as needed. For pain      . glipiZIDE (GLIPIZIDE XL) 2.5 MG 24 hr tablet Take 1 tablet (2.5 mg total) by mouth daily.  30 tablet  2  . metFORMIN (GLUCOPHAGE) 500 MG tablet 1 tab PO qAM x 7d, then 1 tab bid x 7d, then 2 tab qAM and 1 tab qPM x 7d, then 2 tabs bid x 7d  60 tablet  0  . metFORMIN (GLUCOPHAGE) 500 MG tablet Take 2 tablets (1,000 mg total) by mouth 2 (two) times daily with a meal.  120 tablet  5   No current facility-administered medications for this visit.    Allergies: No Known Allergies   Past Medical History: Past Medical History  Diagnosis Date  . Diabetes mellitus without complication     Past Surgical History: No past surgical history on file.  Family History: No family history on file.  Social History: History   Social History  . Marital Status: Married    Spouse Name: N/A    Number of Children:  N/A  . Years of Education: N/A   Occupational History  . Not on file.   Social History Main Topics  . Smoking status: Current Every Day Smoker -- 0.50 packs/day    Types: Cigarettes  . Smokeless tobacco: Not on file     Comment: "Ready to give it up".  . Alcohol Use: No     Comment: Very rarely.  . Drug Use: No  . Sexually Active: Not on file   Other Topics Concern  . Not on file   Social History Narrative  . No narrative on file     Vital Signs: Blood pressure 124/88, pulse 89, temperature 96.9 F (36.1 C), temperature source Oral, height 5\' 10"  (1.778 m), weight 313 lb 8 oz (142.203 kg), SpO2 98.00%.      Objective:   Physical Exam  Constitutional: He is oriented to person, place, and time. He appears well-developed and well-nourished. No distress.  HENT:  Head: Normocephalic and atraumatic.  Eyes: Conjunctivae are normal. Pupils are equal, round, and reactive to light. No scleral icterus.  Neck: Normal range of motion. Neck supple. No tracheal deviation present.  Cardiovascular: Normal rate and regular rhythm.   No murmur heard. Pulmonary/Chest: Effort normal. He has no wheezes. He has no rales.  Abdominal: Soft. Bowel sounds are normal. He exhibits no distension. There is no tenderness.  Musculoskeletal: Normal  range of motion. He exhibits no edema.  Neurological: He is alert and oriented to person, place, and time. No cranial nerve deficit.  Skin: Skin is warm and dry. No erythema.  Psychiatric: He has a normal mood and affect. His behavior is normal.          Assessment & Plan:

## 2012-07-29 NOTE — Patient Instructions (Addendum)
General Instructions: Begin taking glipizide XL 2.5 mg daily, as we discussed during your visit today. Please continue taking your other medications. Please contact us if you have any new or worsening symptoms. We will see back in approximately 3 months to followup on your diabetes mellitus.   Have a great day.    Treatment Goals:  Goals (1 Years of Data) as of 07/29/12         As of Today As of Today 06/14/12 05/23/12 05/19/12     Blood Pressure    . Blood Pressure < 140/90  124/88 140/92 138/88 132/98 130/90     Result Component    . HEMOGLOBIN A1C < 6.5  8.2        . LDL CALC < 100            Progress Toward Treatment Goals:  Treatment Goal 07/29/2012  Hemoglobin A1C deteriorated    Self Care Goals & Plans:  Self Care Goal 07/29/2012  Manage my medications -  Monitor my health -  Eat healthy foods eat foods that are low in salt; eat more vegetables  Be physically active find an activity I enjoy  Stop smoking go to the [employer] ([email or web address])       Care Management & Community Referrals:  Referral 06/14/2012  [facility]

## 2012-07-30 NOTE — Assessment & Plan Note (Signed)
Severe OSA confirmed by sleep study. No CPAP titration performed on initial study, thus patient will need to return to the sleep lab for this to be completed. He was counseled on the importance of weight loss in controlling his OSA symptoms, and states he is committed to losing weight and is going to change his diet accordingly.  - CPAP titration study

## 2012-07-30 NOTE — Assessment & Plan Note (Signed)
Lab Results  Component Value Date   HGBA1C 8.2 07/29/2012   HGBA1C 7.3 04/14/2012     Assessment: Diabetes control: poor control (HgbA1C >9%) Progress toward A1C goal:  deteriorated Comments: worsened control despite increase in anti-hyperglycemic therapy appears secondary to dietary indiscretions. Pt fully compliant with meds per his report.   Plan: Medications:  Continue metformin 1000mg  bid; start glipizide XL 2.5mg  QD Home glucose monitoring: Frequency:   Timing:   Instruction/counseling given: reminded to bring blood glucose meter & log to each visit Educational resources provided: brochure Self management tools provided:   Other plans: as the patient states he will be altering his diet considerably to decrease carb intake, will initiate sulfonylurea therapy at lowest dose. He was instructed to check his CBGs regularly and PRN for any symptoms of hyper- or hypoglycemia.

## 2012-08-02 ENCOUNTER — Encounter: Payer: Self-pay | Admitting: Internal Medicine

## 2012-08-02 DIAGNOSIS — R269 Unspecified abnormalities of gait and mobility: Secondary | ICD-10-CM | POA: Insufficient documentation

## 2012-08-16 ENCOUNTER — Encounter: Payer: No Typology Code available for payment source | Admitting: Radiation Oncology

## 2012-09-12 ENCOUNTER — Other Ambulatory Visit: Payer: Self-pay | Admitting: *Deleted

## 2012-09-12 DIAGNOSIS — E119 Type 2 diabetes mellitus without complications: Secondary | ICD-10-CM

## 2012-09-12 DIAGNOSIS — Z87440 Personal history of urinary (tract) infections: Secondary | ICD-10-CM

## 2012-09-12 DIAGNOSIS — R400 Somnolence: Secondary | ICD-10-CM

## 2012-09-12 DIAGNOSIS — G4733 Obstructive sleep apnea (adult) (pediatric): Secondary | ICD-10-CM

## 2012-09-13 ENCOUNTER — Ambulatory Visit (HOSPITAL_BASED_OUTPATIENT_CLINIC_OR_DEPARTMENT_OTHER): Payer: No Typology Code available for payment source | Attending: Radiation Oncology | Admitting: Radiology

## 2012-09-13 VITALS — Ht 72.0 in | Wt 306.0 lb

## 2012-09-13 DIAGNOSIS — E119 Type 2 diabetes mellitus without complications: Secondary | ICD-10-CM

## 2012-09-13 DIAGNOSIS — G4733 Obstructive sleep apnea (adult) (pediatric): Secondary | ICD-10-CM

## 2012-09-13 DIAGNOSIS — G4710 Hypersomnia, unspecified: Secondary | ICD-10-CM | POA: Insufficient documentation

## 2012-09-13 DIAGNOSIS — R6889 Other general symptoms and signs: Secondary | ICD-10-CM | POA: Insufficient documentation

## 2012-09-13 DIAGNOSIS — F40298 Other specified phobia: Secondary | ICD-10-CM | POA: Insufficient documentation

## 2012-09-13 DIAGNOSIS — R4589 Other symptoms and signs involving emotional state: Secondary | ICD-10-CM | POA: Insufficient documentation

## 2012-09-13 MED ORDER — METFORMIN HCL 500 MG PO TABS: 1000.0000 mg | ORAL_TABLET | Freq: Two times a day (BID) | ORAL | Status: DC

## 2012-09-13 MED ORDER — GLIPIZIDE ER 2.5 MG PO TB24: 2.5000 mg | ORAL_TABLET | Freq: Every day | ORAL | Status: DC

## 2012-09-13 NOTE — Telephone Encounter (Signed)
Rx called in to pharmacy. 

## 2012-09-17 NOTE — Procedures (Signed)
NAME:  ELAN, BRAINERD                 ACCOUNT NO.:  [id number]  MEDICAL RECORD NO.:  [id number]          PATIENT TYPE:  OUT  LOCATION:  SLEEP CENTER                 FACILITY:  [facility]  PHYSICIAN:  Massey Ruhland D. Maple Hudson, MD, FCCP, FACPDATE OF BIRTH:  [birth year]Nov-1980  DATE OF STUDY:  09/13/2012                           NOCTURNAL POLYSOMNOGRAM  REFERRING PHYSICIAN:  EMORY R MCTYRE  INDICATION FOR STUDY:  Hypersomnia with sleep apnea.  EPWORTH SLEEPINESS SCORE:  18/24.  BMI 40.7, weight 300 pounds.  Height 72 inches, neck 20.5 inches.  MEDICATIONS:  Home medications are charted and reviewed.  A baseline diagnostic NPSG on July 18, 2012, had recorded AHI of 61.9 per hour.  Body weight was 314 pounds.  CPAP titration is requested.  SLEEP ARCHITECTURE:  Total sleep time 162.5 minutes with sleep efficiency 42.5%.  Stage I was 12.3%, stage II 63.1%, stage III 3.1%, REM 21.5% of total sleep time.  Sleep latency 6.5 minutes, REM latency 269 minutes.  Awake after sleep onset 213.5 minutes.  Arousal index 9.2. Bedtime medication, metformin.  He was mostly awake until 1 a.m. and then again from 2 a.m. until almost 4 a.m. before finally sustaining sleep.  RESPIRATORY DATA:  CPAP titration protocol.  CPAP was titrated to 12 CWP, AHI 0 per hour.  He wore a ResMed FX nasal pillow mask with medium pillows, heated humidifier, and chin strap.  Sleep maintenance was impaired by claustrophobia and panic attack.  Several masks were tried. He stated he could not sleep on his back due to feeling of suffocation and panic.  He was noted to be a mouth breather.  OXYGEN DATA:  Snoring was prevented at final CPAP with mean oxygen saturation 95.2% on room air.  CARDIAC DATA:  Sinus rhythm.  MOVEMENT-PARASOMNIA:  No significant movement disturbance.  Bathroom x2.  IMPRESSION-RECOMMENDATIONS: 1. Sleep architecture was significant for sustained intervals of     wakefulness during the night, related to his expression  of     claustrophobia and panic associated with having CPAP on his face,     but also with simply attempting to sleep lying on his back.  For     home trial of CPAP, consider supplementation with a sleep     medication if appropriate. 2. Successful CPAP titration to 12 CWP, AHI 0 per hour.  He wore a     ResMed FX nasal pillows mask with medium pillows, heated     humidifier, and chin strap.  Other masks trials were not tolerated.     Snoring was prevented and mean oxygen saturation held 95.2% on room     air with CPAP. 3. Baseline diagnostic NPSG on July 18, 2012, had recorded AHI 61.9     per hour.  Body weight for that study was 314 pounds.     Tawney Vanorman D. Maple Hudson, MD, [facility], FACP Diplomate, American Board of Sleep Medicine    CDY/MEDQ  D:  09/17/2012 11:43:39  T:  09/17/2012 11:57:06  Job:  [id number]

## 2012-09-27 ENCOUNTER — Ambulatory Visit (INDEPENDENT_AMBULATORY_CARE_PROVIDER_SITE_OTHER): Payer: No Typology Code available for payment source | Admitting: Radiation Oncology

## 2012-09-27 VITALS — BP 133/82 | HR 82 | Temp 98.4°F | Ht 71.5 in | Wt 315.2 lb

## 2012-09-27 DIAGNOSIS — G4733 Obstructive sleep apnea (adult) (pediatric): Secondary | ICD-10-CM

## 2012-09-27 DIAGNOSIS — E785 Hyperlipidemia, unspecified: Secondary | ICD-10-CM

## 2012-09-27 DIAGNOSIS — A630 Anogenital (venereal) warts: Secondary | ICD-10-CM

## 2012-09-27 DIAGNOSIS — Z87440 Personal history of urinary (tract) infections: Secondary | ICD-10-CM

## 2012-09-27 DIAGNOSIS — G4710 Hypersomnia, unspecified: Secondary | ICD-10-CM

## 2012-09-27 DIAGNOSIS — R400 Somnolence: Secondary | ICD-10-CM

## 2012-09-27 DIAGNOSIS — E119 Type 2 diabetes mellitus without complications: Secondary | ICD-10-CM

## 2012-09-27 LAB — GLUCOSE, CAPILLARY: Glucose-Capillary: 92 mg/dL (ref 70–99)

## 2012-09-27 MED ORDER — METFORMIN HCL 500 MG PO TABS: 1000.0000 mg | ORAL_TABLET | Freq: Two times a day (BID) | ORAL | Status: DC

## 2012-09-27 MED ORDER — PODOFILOX 0.5 % EX GEL: Freq: Two times a day (BID) | CUTANEOUS | Status: DC

## 2012-09-27 NOTE — Assessment & Plan Note (Signed)
Pt has read about a CPAP assistance program and has brought application paperwork to today's clinic visit. We will forward this paperwork to [employer] (CSW) and complete the requisite portions in an attempt to help the patient receive the CPAP device/mask he needs for management of his OSA.

## 2012-09-27 NOTE — Patient Instructions (Addendum)
Please begin using podofilox as we discussed today. Contact us if your symptoms do not improve after 4 cycles of treatment (4 weeks).   Stop taking glipizide.   Continue taking metformin as previously prescribed.   Contact us if you have any further episodes of low blood sugar as we discussed during today's visit.   We will continue to work on solutions for helping you obtain the CPAP mask/machine that you need for your sleep apnea. Please continue your own efforts in applying to the assistance programs we discussed.   It was nice to see you. Have a great day.   Podofilox topical gel What is this medicine? PODOFILOX (po do FIL ox) is used to remove genital or perianal warts. This medicine may be used for other purposes; ask your health care provider or pharmacist if you have questions. What should I tell my health care provider before I take this medicine? They need to know if you have any of these conditions: -an unusual or allergic reaction to podofilox, podophyllum resin, other medicines, foods, dyes or preservatives -pregnant or trying to get pregnant -breast-feeding How should I use this medicine? This medicine is for external use only. Do not take by mouth. This medicine may be used to remove warts on areas of the skin around the vagina or penis and between the rectum and the genitals. It should not be used to treat warts that are inside the rectum, vagina, or penis. Follow the directions on the prescription label. Wash hands before and after use. Apply the gel to the specific wart as instructed by your doctor or health care professional. Use the applicator provided or a cotton-tipped applicator. Applicators should not be re-used. Make sure that the gel is dry before normal, untreated skin comes into contact with the treated skin. This medicine can cause severe irritation of normal skin. If contact with normal skin occurs, immediately flush the area thoroughly with water. Avoid contact  with the eyes. If eye contact occurs, immediately flush the eye with large quantities of water for 15 minutes and seek medical attention. Do not use this medicine more often or for longer than directed. Talk to your pediatrician regarding the use of this medicine in children. Special care may be needed. Overdosage: If you think you have taken too much of this medicine contact a poison control center or emergency room at once. NOTE: This medicine is only for you. Do not share this medicine with others. What if I miss a dose? If you miss a dose, use it as soon as you can. If it is almost time for your next dose, use only that dose. Do not use double or extra doses. What may interact with this medicine? Interactions are not expected. Do not use any other skin products on the same area of skin without asking your doctor or health care professional. This list may not describe all possible interactions. Give your health care provider a list of all the medicines, herbs, non-prescription drugs, or dietary supplements you use. Also tell them if you smoke, drink alcohol, or use illegal drugs. Some items may interact with your medicine. What should I watch for while using this medicine? Visit your doctor or health care professional for regular checks on your progress. This medicine is not a cure. New warts may develop during or after treatment. Tell your doctor or health care professional if your symptoms do not start to get better within one week. The weekly treatment course can be  repeated up to 4 times. If the wart does not go away in 4 weeks, a different treatment should be considered. If you are pregnant or think you might be pregnant, contact your doctor or health care professional. Sexual (genital, oral, anal) contact should be avoided while the medication is on the skin. The only way to prevent infecting others with the HPV virus (the virus that causes genital warts) is to avoid direct skin-to-skin  contact. If warts are visible in the genital area, sexual contact should be avoided until the warts are treated. Experts advise that using latex condoms during sexual contact may reduce, but not entirely prevent, infecting others. What side effects may I notice from receiving this medicine? Side effects that you should report to your doctor or health care professional as soon as possible: -allergic reactions like skin rash, itching or hives, swelling of the face, lips, or tongue -bleeding, blistering, burning, crusting, or scabbing of treated skin -blood in the urine -dizziness -vomiting (may indicate excessive dosage) Side effects that usually do not require medical attention (report to your doctor or health care professional if they continue or are bothersome): -dryness, flaking or peeling of the skin -headache -mild redness, itching or stinging of the skin This list may not describe all possible side effects. Call your doctor for medical advice about side effects. You may report side effects to FDA at 1-800-FDA-1088. Where should I keep my medicine? Keep out of the reach of children. Store at room temperature between 15 and 30 degrees C (59 and 86 degrees F). Do not freeze. Keep container tightly closed. This medicine contains alcohol and is flammable. Do not store near heat or open flame. Throw away any unused medicine after the expiration date. NOTE: This sheet is a summary. It may not cover all possible information. If you have questions about this medicine, talk to your doctor, pharmacist, or health care provider.  2012, [employer]vier/[employer] Standard. (11/14/2007 4:12:59 PM)

## 2012-09-27 NOTE — Assessment & Plan Note (Signed)
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 Pt was previously sexually active but has not been sexually active over the last year.  - podofilox 0.5% gel applied locally for 3 consecutive days followed by 4 days without treatment (repeating as necessary x 4 weeks until resolved) - check HIV antibody

## 2012-09-27 NOTE — Assessment & Plan Note (Addendum)
Lab Results  Component Value Date   HGBA1C 8.2 07/29/2012   HGBA1C 7.3 04/14/2012     Assessment: Diabetes control: fair control Progress toward A1C goal:  unable to assess Comments: pt reports hypoglycemia after starting glipizide (see HPI for details)  Plan: Medications:  Discontinue glipizide. Continue metformin 1000mg  bid.  Home glucose monitoring: Frequency:   Timing:   Instruction/counseling given: reminded to bring blood glucose meter & log to each visit Educational resources provided:   Self management tools provided:   Other plans: Check lipid panel. Recheck A1c at next visit.

## 2012-09-27 NOTE — Progress Notes (Signed)
Subjective:    Patient ID: Brady Gibbs, male    DOB: [birth year], 32 y.o.   MRN: [id number]  Diabetes   Patient presents to clinic for a routine visit to discuss his diabetes mellitus and obstructive sleep apnea.   Diabetes mellitus -  Patient states that since he started taking glipizide on his previous visit, he has occasionally had episodes of symptomatic hypoglycemia (diaphoresis, anxiety), and his CBGs at these times have been as low as 40 (per his report). He states that during each of these episodes he ate a snack and felt better within several minutes. He did not bring his glucometer with him to today's visit. He states that he has been fully compliant with metformin as prescribed at 1000 mg twice a day. He states he's been trying to eat a healthier diet consisting mostly of salads, and also has been exercising more regularly since his last visit.  OSA - patient has completed both phases of his sleep study. Unfortunately he has not been able to acquire a CPAP device/mask due to the absence of assistance programs being currently available through the [facility] for [employer] [facility]) holders.   Mr. Wyatt only other complaint today is of a right scrotal "bump" which has been bothering him recently but is not painful.    Review of Systems  All other systems reviewed and are negative.   Current Outpatient Medications: Current Outpatient Prescriptions  Medication Sig Dispense Refill  . acetaminophen (TYLENOL) 500 MG tablet Take 1,000 mg by mouth every 6 (six) hours as needed. For pain      . metFORMIN (GLUCOPHAGE) 500 MG tablet 1 tab PO qAM x 7d, then 1 tab bid x 7d, then 2 tab qAM and 1 tab qPM x 7d, then 2 tabs bid x 7d  60 tablet  0  . metFORMIN (GLUCOPHAGE) 500 MG tablet Take 2 tablets (1,000 mg total) by mouth 2 (two) times daily with a meal.  120 tablet  5  . podofilox (CONDYLOX) 0.5 % gel Apply topically 2 (two) times daily. Apply for 3 consecutive days followed by 4 days without  treatment. Repeat as needed.  3.5 g  0   No current facility-administered medications for this visit.    Allergies: Not on File   Past Medical History: Past Medical History  Diagnosis Date  . Diabetes mellitus without complication     Past Surgical History: No past surgical history on file.  Family History: No family history on file.  Social History: History   Social History  . Marital Status: Married    Spouse Name: N/A    Number of Children: N/A  . Years of Education: N/A   Occupational History  . Not on file.   Social History Main Topics  . Smoking status: Current Every Day Smoker -- 0.50 packs/day    Types: Cigarettes  . Smokeless tobacco: Not on file     Comment: "Ready to give it up".  . Alcohol Use: No     Comment: Very rarely.  . Drug Use: No  . Sexually Active: Not on file   Other Topics Concern  . Not on file   Social History Narrative  . No narrative on file     Vital Signs: Blood pressure 133/82, pulse 82, temperature 98.4 F (36.9 C), temperature source Oral, height 5' 11.5" (1.816 m), weight 315 lb 3.2 oz (142.974 kg), SpO2 99.00%.       Objective:   Physical Exam  Constitutional: He is oriented  to person, place, and time. He appears well-developed and well-nourished. No distress.  HENT:  Head: Normocephalic and atraumatic.  Eyes: Conjunctivae are normal. Pupils are equal, round, and reactive to light. No scleral icterus.  Neck: Normal range of motion. Neck supple. No tracheal deviation present.  Cardiovascular: Normal rate and regular rhythm.   No murmur heard. Pulmonary/Chest: Effort normal. He has no wheezes. He has no rales.  Abdominal: Soft. Bowel sounds are normal. He exhibits no distension. There is no tenderness.  Genitourinary:  Small R scrotal verrucous lesion (~3mm in diameter) with no associated erythema or tenderness.   Musculoskeletal: Normal range of motion. He exhibits no edema.  Neurological: He is alert and  oriented to person, place, and time. No cranial nerve deficit.  Skin: Skin is warm and dry. No erythema.  Psychiatric: He has a normal mood and affect. His behavior is normal.          Assessment & Plan:

## 2012-09-28 ENCOUNTER — Telehealth: Payer: Self-pay | Admitting: *Deleted

## 2012-09-28 DIAGNOSIS — E785 Hyperlipidemia, unspecified: Secondary | ICD-10-CM | POA: Insufficient documentation

## 2012-09-28 LAB — LIPID PANEL
Cholesterol: 202 mg/dL — ABNORMAL HIGH (ref 0–200)
Total CHOL/HDL Ratio: 7.5 Ratio

## 2012-09-28 LAB — HIV ANTIBODY (ROUTINE TESTING W REFLEX): HIV: NONREACTIVE

## 2012-09-28 MED ORDER — PRAVASTATIN SODIUM 40 MG PO TABS: 40.0000 mg | ORAL_TABLET | Freq: Every evening | ORAL | Status: DC

## 2012-09-28 MED ORDER — PRAVASTATIN SODIUM 40 MG PO TABS: 20.0000 mg | ORAL_TABLET | Freq: Every evening | ORAL | Status: DC

## 2012-09-28 NOTE — Assessment & Plan Note (Signed)
Lipid panel revealed pt's LDL = 111. Goal LDL = 100 given his history of T2DM. Will initiate statin therapy at this time.  - pravastatin 30mg  QD

## 2012-09-28 NOTE — Telephone Encounter (Signed)
Fax from [facility] - Podofilox gel called to pharmacy; refilled yesterday per Dr Lavena Bullion.

## 2012-09-28 NOTE — Addendum Note (Signed)
Addended by: Elenor Legato R on: 09/28/2012 10:36 AM   Modules accepted: Orders

## 2012-10-05 ENCOUNTER — Encounter: Payer: Self-pay | Admitting: Dietician

## 2012-10-05 NOTE — Progress Notes (Signed)
TEACHING ATTENDING ADDENDUM: I discussed this case with Dr. Lavena Bullion at the time of the patient visit. I agree with the HPI, exam findings and have read the documentation provided by the resident,  and I concur with the plan of care. Please see the resident note for details of management.

## 2012-10-31 ENCOUNTER — Ambulatory Visit (INDEPENDENT_AMBULATORY_CARE_PROVIDER_SITE_OTHER): Payer: No Typology Code available for payment source | Admitting: Internal Medicine

## 2012-10-31 ENCOUNTER — Encounter: Payer: Self-pay | Admitting: Internal Medicine

## 2012-10-31 VITALS — BP 146/97 | HR 67 | Temp 97.2°F | Ht 72.0 in | Wt 315.8 lb

## 2012-10-31 DIAGNOSIS — G4733 Obstructive sleep apnea (adult) (pediatric): Secondary | ICD-10-CM

## 2012-10-31 DIAGNOSIS — E119 Type 2 diabetes mellitus without complications: Secondary | ICD-10-CM

## 2012-10-31 DIAGNOSIS — Z23 Encounter for immunization: Secondary | ICD-10-CM

## 2012-10-31 DIAGNOSIS — G4730 Sleep apnea, unspecified: Secondary | ICD-10-CM

## 2012-10-31 DIAGNOSIS — E785 Hyperlipidemia, unspecified: Secondary | ICD-10-CM

## 2012-10-31 LAB — GLUCOSE, CAPILLARY: Glucose-Capillary: 116 mg/dL — ABNORMAL HIGH (ref 70–99)

## 2012-10-31 LAB — POCT GLYCOSYLATED HEMOGLOBIN (HGB A1C): Hemoglobin A1C: 6.3

## 2012-10-31 NOTE — Progress Notes (Signed)
Subjective:   Patient ID: Kenric Ginger male   DOB: 02/14/1981 32 y.o.   MRN: [id number]  HPI: Mr.Regnald Hy is a 32 y.o. obese african [place] male with DM2 and OSA presenting to clinic today for follow up of his CPAP papework.  He was last seen by Dr. Lavena Bullion on 6.3.14 for OSA and completed his sleep studies with last CPAP titration report 08/2012 with noted AHI 1.1 and RDI 3.  Unfortunately, he has not been able to acquire his CPAP machine due to affordability at this time but is looking for assistance programs and brought paperwork today to have it filled.  We will have our social work contact him as soon as possible to assist with this paper work.  He continues to report feeling very tired and often falling asleep.    Additionally, Mr. Deans continues to smoke cigarettes, ~1/2ppd.  He says he will try to cut down and hopefully quit again.  We discussed cessation today for quite some time, i encouraged him to try NRT and call 1800quitnow for further free assistance.    Past Medical History  Diagnosis Date  . Diabetes mellitus without complication    Current Outpatient Prescriptions  Medication Sig Dispense Refill  . metFORMIN (GLUCOPHAGE) 500 MG tablet Take 2 tablets (1,000 mg total) by mouth 2 (two) times daily with a meal.  120 tablet  5  . podofilox (CONDYLOX) 0.5 % gel Apply topically 2 (two) times daily. Apply for 3 consecutive days followed by 4 days without treatment. Repeat as needed.  3.5 g  0  . acetaminophen (TYLENOL) 500 MG tablet Take 1,000 mg by mouth every 6 (six) hours as needed. For pain      . pravastatin (PRAVACHOL) 40 MG tablet Take 0.5 tablets (20 mg total) by mouth every evening.  30 tablet  11   No current facility-administered medications for this visit.   No family history on file. History   Social History  . Marital Status: Married    Spouse Name: N/A    Number of Children: N/A  . Years of Education: N/A   Social History Main Topics  . Smoking status:  Current Every Day Smoker -- 0.50 packs/day    Types: Cigarettes  . Smokeless tobacco: None     Comment: "Ready to give it up".  . Alcohol Use: No     Comment: Very rarely.  . Drug Use: No  . Sexually Active: None   Other Topics Concern  . None   Social History Narrative  . None   Review of Systems:  Constitutional:  Denies fever, chills, diaphoresis, appetite change and fatigue.   HEENT:  Denies congestion, sore throat, rhinorrhea, sneezing, mouth sores, trouble swallowing, neck pain   Respiratory:  Denies SOB, DOE, cough, and wheezing.   Cardiovascular:  Denies palpitations and leg swelling.   Gastrointestinal:  Denies nausea, vomiting, abdominal pain, diarrhea, constipation, blood in stool and abdominal distention.   Genitourinary:  Denies dysuria, urgency, frequency, hematuria, flank pain and difficulty urinating.   Musculoskeletal:  Denies myalgias, back pain, joint swelling, arthralgias and gait problem.   Skin:  Denies pallor, rash and wound.   Neurological:  Denies dizziness, seizures, syncope, weakness, light-headedness, numbness and headaches.    Objective:  Physical Exam: Filed Vitals:   10/31/12 0901  BP: 146/97  Pulse: 67  Temp: 97.2 F (36.2 C)  TempSrc: Oral  Height: 6' (1.829 m)  Weight: 315 lb 12.8 oz (143.246 kg)  SpO2: 100%  Vitals reviewed. General: sitting in chair, NAD HEENT: PERRL, EOMI, no scleral icterus Cardiac: RRR, no rubs, murmurs or gallops Pulm: clear to auscultation bilaterally, no wheezes, rales, or rhonchi Abd: soft, obese, nontender, nondistended, BS present Ext: warm and well perfused, no pedal edema Neuro: alert and oriented X3, cranial nerves II-XII grossly intact, strength and sensation to light touch equal in bilateral upper and lower extremities  Assessment & Plan:  Discussed with Dr. Dalphine Handing -OSA: social work consult, assistance for CPAP Smoking cessation -pneumococcal vaccine today, referred for clinic eye exam, A1C  improved today

## 2012-10-31 NOTE — Progress Notes (Signed)
Case discussed with Dr. Qureshi at the time of the visit.  We reviewed the resident's history and exam and pertinent patient test results together.  I agree with the assessment, diagnosis, and plan of care documented in the resident's note.   

## 2012-10-31 NOTE — Patient Instructions (Addendum)
General Instructions:  Please start taking your pravastatin (cholesterol medicine from [facility])  We will have our social worker contact you to help with the CPAP  Please STOP SMOKING! CALL 1800QUITNOW for free assistance  Work on diet and exercise  You are getting your pneumococcal vaccine today and we will schedule you for your eye exam and have social work contact you  Treatment Goals:  Goals (1 Years of Data) as of 10/31/12         As of Today 09/27/12 07/29/12 07/29/12 06/14/12     Blood Pressure    . Blood Pressure < 140/90  146/97 133/82 124/88 140/92 138/88     Lifestyle    . Prevent Falls           Result Component    . HEMOGLOBIN A1C < 6.5  6.3  8.2      . LDL CALC < 100   111         Progress Toward Treatment Goals:  Treatment Goal 10/31/2012  Hemoglobin A1C improved  Stop smoking smoking the same amount  Prevent falls at goal    Self Care Goals & Plans:  Self Care Goal 10/31/2012  Manage my medications take my medicines as prescribed; refill my medications on time  Monitor my health keep track of my weight; check my feet daily  Eat healthy foods eat baked foods instead of fried foods; eat foods that are low in salt; eat smaller portions  Be physically active find an activity I enjoy  Stop smoking cut down the number of cigarettes smoked; go to the [employer] ([employer])  Prevent falls use home fall prevention checklist to improve safety    Home Blood Glucose Monitoring 10/31/2012  Check my blood sugar once a day     Care Management & Community Referrals:  Referral 06/14/2012  [facility]

## 2012-10-31 NOTE — Assessment & Plan Note (Signed)
Had not started pravastatin yet  -start pravastatin, can get at [facility] department

## 2012-10-31 NOTE — Assessment & Plan Note (Signed)
Lab Results  Component Value Date   HGBA1C 6.3 10/31/2012   HGBA1C 8.2 07/29/2012   HGBA1C 7.3 04/14/2012    Assessment: Diabetes control: good control (HgbA1C at goal) Progress toward A1C goal:  improved Comments: discussed weight loss and exercise  Plan: Medications:  continue current medications metformin bid Home glucose monitoring: Frequency: once a day Timing:   Instruction/counseling given: reminded to get eye exam, reminded to bring blood glucose meter & log to each visit, reminded to bring medications to each visit, discussed foot care, discussed the need for weight loss and discussed diet Educational resources provided: brochure Self management tools provided:   Other plans: eye exam in clinic scheduled today, gave pneumococcal vaccine

## 2012-10-31 NOTE — Assessment & Plan Note (Signed)
Still does not have CPAP machine but looking at assistance programs  -re-consulted social work to assist with paper work , current paperwork requires cpap machine to be delivered to facility

## 2012-11-03 ENCOUNTER — Encounter: Payer: Self-pay | Admitting: Dietician

## 2012-11-03 ENCOUNTER — Other Ambulatory Visit: Payer: Self-pay

## 2012-11-03 ENCOUNTER — Ambulatory Visit (INDEPENDENT_AMBULATORY_CARE_PROVIDER_SITE_OTHER): Payer: No Typology Code available for payment source | Admitting: Dietician

## 2012-11-03 DIAGNOSIS — E119 Type 2 diabetes mellitus without complications: Secondary | ICD-10-CM

## 2012-11-03 LAB — HM DIABETES EYE EXAM

## 2012-11-03 NOTE — Patient Instructions (Addendum)
You are doing a great job caring for your health and your diabetes!   Here is a review and written copy of what we talked about for you. I will try to call you in the next month to follow up and see how you are doing.  Please feel free to schedule an appointment with me on same day you see your doctor to support you in reaching your goals!  For the numbness and tingling-  1- exercise- every day- walking briskly is good exercise 2- lower your blood sugar to normal 70-99 most of the time on your machine and an A1C < 5.9% 3- talk to doctor about checking your vitamin B12 4- Could consider trying to eat more nuts and seeds - especially W[employer]-  that are high in Alpha Lipoic Acid( ALA)  For weight loss:  1- Exercise for 60 minutes to and hour 5 days a weekly  2- Eat 4-6 times a day,   Try to eat 500-700 calories for meals 3x a day and 200 for snacks at least 2 times a day           Breakfast- 500- 600  Calories- egg sandwich on whole grain bread, fruit     SNACK- about 200 calories- a half sandwich and 8 oz glass of skim milk  Lunch- 500-600 calories- half a plate or more of veggies, low fat meat/protein, starch and fruit      Snack- about 200 calories- Bowl of yogurt and fruit   Supper/Dinner- 500-800 calories - half a plate or more of veggies, low fat meat/protein, starch and fruit  DASH Diet The DASH diet stands for "Dietary Approaches to Stop Hypertension." It is a healthy eating plan that has been shown to reduce high blood pressure (hypertension) in as little as 14 days, while also possibly providing other significant health benefits. These other health benefits include reducing the risk of cancer and  reducing the risk of type 2 diabetes, heart disease, colon cancer, and stroke. Health benefits also include weight loss and slowing kidney failure in patients with chronic kidney disease.  DIET GUIDELINES  Limit salt (sodium). Your diet should contain less than 1500 mg of sodium  daily.  Limit refined or processed carbohydrates. Your diet should include mostly whole grains. Desserts and added sugars should be used sparingly.  Include small amounts of heart-healthy fats. These types of fats include nuts, oils, and tub margarine. Limit saturated and trans fats. These fats have been shown to be harmful in the body. CHOOSING FOODS  The following food groups are based on a 2000 calorie diet. See your Registered Dietitian for individual calorie needs. Grains and Grain Products (6 to 8 servings daily)  Eat More Often: Whole-wheat bread, brown rice, whole-grain or wheat pasta, quinoa, popcorn without added fat or salt (air popped).  Eat Less Often: White bread, white pasta, white rice, cornbread. Vegetables (4 to 5 servings daily)  Eat More Often: Fresh, frozen, and canned vegetables. Vegetables may be raw, steamed, roasted, or grilled with a minimal amount of fat.  Eat Less Often/Avoid: Creamed or fried vegetables. Vegetables in a cheese sauce. Fruit (4 to 5 servings daily)  Eat More Often: All fresh, canned (in natural juice), or frozen fruits. Dried fruits without added sugar. One hundred percent fruit juice ( cup [237 mL] daily).  Eat Less Often: Dried fruits with added sugar. Canned fruit in light or heavy syrup. [employer], Fish, and Poultry (2 servings or less daily. One serving is  3 to 4 oz [85-114 g]).  Eat More Often: Ninety percent or leaner ground beef, tenderloin, sirloin. Round cuts of beef, chicken breast, [place] breast. All fish. Grill, bake, or broil your meat. Nothing should be fried.  Eat Less Often/Avoid: Fatty cuts of meat, [place], or chicken leg, thigh, or wing. Fried cuts of meat or fish. Dairy (2 to 3 servings)  Eat More Often: Low-fat or fat-free milk, low-fat plain or light yogurt, reduced-fat or part-skim cheese.  Eat Less Often/Avoid: Milk (whole, 2%).Whole milk yogurt. Full-fat cheeses. Nuts, Seeds, and Legumes (4 to 5 servings per  week)  Eat More Often: All without added salt.  Eat Less Often/Avoid: Salted nuts and seeds, canned beans with added salt. Fats and Sweets (limited)  Eat More Often: Vegetable oils, tub margarines without trans fats, sugar-free gelatin. Mayonnaise and salad dressings.  Eat Less Often/Avoid: Coconut oils, palm oils, butter, stick margarine, cream, half and half, cookies, candy, pie.

## 2012-11-03 NOTE — Progress Notes (Signed)
Diabetes Self-Management Education  Visit Number: Follow-up 1  11/03/2012 Brady Gibbs, identified by name and date of birth, is a 32 y.o. male with Type 2 diabetes  .   ASSESSMENT  Patient Concerns:     Here for eye exam and continued diabetes self management training  Lab Results: LDL Cholesterol  Date Value Range Status  09/27/2012 111* 0 - 99 mg/dL Final          Hemoglobin A1C  Date Value Range Status  10/31/2012 6.3   Final     No family history on file. History  Substance Use Topics  . Smoking status: Current Every Day Smoker -- 0.50 packs/day    Types: Cigarettes  . Smokeless tobacco: Not on file     Comment: "Ready to give it up".  . Alcohol Use: No     Comment: Very rarely.    Support Systems:   mother and siblings Special Needs:    working on trying to get better sleep( obtaining and CPap), so he can work and loose weight Prior DM Education:  yes- here 1x Daily Foot Exams:  yes- daily Patient Belief / Attitude about Diabetes:   diabetes can be controlled  Assessment comments: patient likes to cook, doesn't eat out much. He is trying to eat healthier and exercise to lose weight because he thinks this will help his sleep apnea   Individualized Plan for Diabetes Self-Management Training:  Needs to finish acute complications and follow up in 3 months  Education Topics Reviewed with Patient Today:  Topic Points Discussed  Disease State Definition of diabetes, type 1 and 2, and the diagnosis of diabetes;Factors that contribute to the development of diabetes;Explored patient's options for treatment of their diabetes  Nutrition Management Meal timing in regards to the patients' current diabetes medication.;Meal options for control of blood glucose level and chronic complications.  Physical Activity and Exercise Role of exercise on diabetes management, blood pressure control and cardiac health.  Medications Reviewed patients medication for diabetes, action,  purpose, timing of dose and side effects.  Monitoring    Acute Complications Covered sick day management with medication and food.  Chronic Complications Relationship between chronic complications and blood glucose control  Psychosocial Adjustment Helped patient identify a support system for diabetes management  Goal Setting    Preconception Care (if applicable)      PATIENTS GOALS   Learning Objective(s):     Goal The patient agrees to:  Nutrition    Physical Activity 30 minutes per day  Medications    Monitoring    Problem Solving    Reducing Risk Other (comment)  Health Coping     Patient Self-Evaluation of Goals (Subsequent Visits)  Goal The patient rates self as meeting goals (% of time)  Nutrition 50 - 75 %  Physical Activity    Medications    Monitoring    Problem Solving    Reducing Risk    Health Coping       PERSONALIZED PLAN / SUPPORT  Self-Management Support:  Doctor's office;Friends;Family;CDE visits ______________________________________________________________________  Outcomes  Expected Outcomes:  Demonstrated interest in learning. Expect positive outcomes Self-Care Barriers:  Lack of transportation;Lack of material resources;Other (comment) Education material provided: yes If problems or questions, patient to contact team via:  Phone Time in: 1030     Time out: 1130  Future DSME appointment: - 3-4 months   Javan Gonzaga, Lupita Leash 11/03/2012 11:49 AM

## 2012-11-04 ENCOUNTER — Telehealth: Payer: Self-pay | Admitting: Licensed Clinical Social Worker

## 2012-11-04 NOTE — Telephone Encounter (Signed)
CSW left message informing pt of [facility] Application.  CSW will fax completed referral to [facility] for CPAP machine once on chart.

## 2012-11-04 NOTE — Addendum Note (Signed)
Addended by: Baltazar Apo on: 11/04/2012 10:17 AM   Modules accepted: Orders

## 2012-11-07 NOTE — Telephone Encounter (Signed)
Referral for CPAP [employer] faxed.

## 2012-11-09 NOTE — Addendum Note (Signed)
Addended by: Baltazar Apo on: 11/09/2012 10:31 AM   Modules accepted: Orders

## 2012-11-18 NOTE — Addendum Note (Signed)
Addended by: Bufford Spikes on: 11/18/2012 11:59 AM   Modules accepted: Orders

## 2012-11-30 ENCOUNTER — Ambulatory Visit: Payer: No Typology Code available for payment source

## 2012-12-05 ENCOUNTER — Ambulatory Visit: Payer: Self-pay

## 2012-12-08 ENCOUNTER — Ambulatory Visit (INDEPENDENT_AMBULATORY_CARE_PROVIDER_SITE_OTHER): Payer: Self-pay | Admitting: Internal Medicine

## 2012-12-08 ENCOUNTER — Encounter: Payer: Self-pay | Admitting: Internal Medicine

## 2012-12-08 VITALS — BP 124/74 | HR 73 | Temp 97.5°F | Ht 72.0 in | Wt 313.6 lb

## 2012-12-08 DIAGNOSIS — E785 Hyperlipidemia, unspecified: Secondary | ICD-10-CM

## 2012-12-08 DIAGNOSIS — E119 Type 2 diabetes mellitus without complications: Secondary | ICD-10-CM

## 2012-12-08 DIAGNOSIS — A630 Anogenital (venereal) warts: Secondary | ICD-10-CM

## 2012-12-08 DIAGNOSIS — G4733 Obstructive sleep apnea (adult) (pediatric): Secondary | ICD-10-CM

## 2012-12-08 NOTE — Assessment & Plan Note (Addendum)
Patient has still not been able to get CPAP machine.  Has filled out paperwork for hardship with [facility] and is also applying for Medicaid.  He reports he will stop by [facility] after the visit today and will call our social worker Lynnae January tomorrow if unable to get device so we can follow up. Patient continues to report trouble sleeping and fatigue.  He is also frustrated that he has not been able to lose weight despite diet changes.

## 2012-12-08 NOTE — Assessment & Plan Note (Signed)
LDL 111, now taking Pravastatin 40mg , denies side effects.  Will check CMP and Lipid panel at next visit in 3 months.

## 2012-12-08 NOTE — Assessment & Plan Note (Addendum)
Lab Results  Component Value Date   HGBA1C 6.3 10/31/2012   HGBA1C 8.2 07/29/2012   HGBA1C 7.3 04/14/2012     Assessment: Diabetes control: good control (HgbA1C at goal) Progress toward A1C goal:    Comments: continue current medications  Plan: Medications:  continue current medications Home glucose monitoring: Frequency: once a day Timing: before breakfast Instruction/counseling given: reminded to bring blood glucose meter & log to each visit and reminded to bring medications to each visit Educational resources provided: brochure Self management tools provided: instructions for home glucose monitoring Other plans: BP well controlled today without medications.  Low Salt diet discussed and patient to elevate legs if swelling recurs. Patient does report progress with eating a healthier diet, he reports cutting out fried foods and drinking water instead of Soda.  He is frustrated that he has yet to loose any weight but thinks that once he gets CPAP and has more energy he will be able to exercise more.  He also reports he will continue to try to cut down on smoking and try to quit however he is not confident he will be able to until he gets CPAP and feels better.  For patients hair loss, metformin adverse effects was checked on uptodate, patient was informed that hair loss was not a reported side effect of metformin and that he seems to be doing very well on metformin.  He reports he will continue to take metformin 1g BID he reports he may try OTC Rogaine for hair loss.

## 2012-12-08 NOTE — Patient Instructions (Addendum)
1.  We will refer you to a dermatologist, but it may take a month before they will be able to give you an appoint. 2.  Try to substitute Mrs Sharilyn Sites for salt with meals and elevated your feet to decrease swelling. 3.  You are doing a great job with your diabetes continue checking your sugars every morning and as needed. 4.  Continue to take pravastatin for you cholesterol. 5.  Stop taking Condylox

## 2012-12-08 NOTE — Assessment & Plan Note (Addendum)
Has tried 4 cycles of condylox gel with no improvement.  D/C condylox. Will refer to Dermatologist for evaluation.

## 2012-12-08 NOTE — Progress Notes (Signed)
Patient ID: Brady Gibbs, male   DOB: [birth year], 32 y.o.   MRN: [id number]   Subjective:   Patient ID: Brady Gibbs male   DOB: 14-May-1980 32 y.o.   MRN: [id number]  HPI: [name] is a 32 y.o. male who presents today for follow up visit and to establish with new PCP.  He current reports that he is doing very well, only complaints are of some leg swelling that happened a few days ago after sleeping sitting up in chair.  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  Lastly he is concerned about some hair loss at the top of his head which he has noticed since being started on metformin. He reports he has been checking his BS every morning, then as needed if he feels symptoms of hypo or hyperglycemia.  He reports readings of  90s-100s in morning.  85 lowest.  290 highest 2 weeks ago.  His last A1C was 6.3.  Denies any side effects from medications.   Past Medical History  Diagnosis Date  . Diabetes mellitus without complication    Current Outpatient Prescriptions  Medication Sig Dispense Refill  . metFORMIN (GLUCOPHAGE) 500 MG tablet Take 2 tablets (1,000 mg total) by mouth 2 (two) times daily with a meal.  120 tablet  5  . podofilox (CONDYLOX) 0.5 % gel Apply topically 2 (two) times daily. Apply for 3 consecutive days followed by 4 days without treatment. Repeat as needed.  3.5 g  0  . pravastatin (PRAVACHOL) 40 MG tablet Take 0.5 tablets (20 mg total) by mouth every evening.  30 tablet  11  . acetaminophen (TYLENOL) 500 MG tablet Take 1,000 mg by mouth every 6 (six) hours as needed. For pain       No current facility-administered medications for this visit.   History reviewed. No pertinent family history. History   Social History  . Marital Status: Married    Spouse Name: N/A    Number of Children: N/A  . Years of Education: 10   Social History Main Topics  . Smoking status: Current Every Day  Smoker -- 0.50 packs/day    Types: Cigarettes  . Smokeless tobacco: None     Comment: "Ready to give it up".  . Alcohol Use: No     Comment: Very rarely.  . Drug Use: No  . Sexual Activity: None   Other Topics Concern  . None   Social History Narrative  . None   Review of Systems: Review of Systems  Constitutional: Negative for fever, chills and weight loss.  HENT: Positive for congestion. Negative for sore throat.   Eyes: Negative for blurred vision and double vision.  Respiratory: Positive for cough and sputum production. Negative for shortness of breath.   Cardiovascular: Positive for leg swelling. Negative for chest pain.  Gastrointestinal: Positive for abdominal pain. Negative for diarrhea and constipation.  Genitourinary: Negative for dysuria.  Musculoskeletal: Negative for myalgias.  Neurological: Positive for headaches. Negative for dizziness.  Psychiatric/Behavioral: Positive for depression (occasionally).    Objective:  Physical Exam: Filed Vitals:   12/08/12 1344  BP: 124/74  Pulse: 73  Temp: 97.5 F (36.4 C)  TempSrc: Oral  Height: 6' (1.829 m)  Weight: 313 lb 9.6 oz (142.248 kg)  SpO2: 98%   Physical Exam  Constitutional: He is oriented to person, place, and time and well-developed, well-nourished, and in no distress.  No distress.  HENT:  Head: Normocephalic and atraumatic.  Eyes: Conjunctivae and EOM are normal.  Cardiovascular: Normal rate, regular rhythm and normal heart sounds.   No murmur heard. Pulmonary/Chest: Effort normal and breath sounds normal. No respiratory distress. He has no wheezes. He has no rales. He exhibits no tenderness.  Abdominal: Soft. Bowel sounds are normal. He exhibits no distension. There is no tenderness.  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  Neurological: He is alert and oriented to person, place, and time.  Skin: Skin is warm and dry. He is not diaphoretic.  Psychiatric:  Affect and judgment normal.    Assessment & Plan:   See Problem based A&P

## 2012-12-09 ENCOUNTER — Telehealth: Payer: Self-pay | Admitting: Licensed Clinical Social Worker

## 2012-12-09 NOTE — Progress Notes (Signed)
I saw and evaluated the patient.  I personally confirmed the key portions of the history and exam documented by Dr. Hoffman and I reviewed pertinent patient test results.  The assessment, diagnosis, and plan were formulated together and I agree with the documentation in the resident's note. 

## 2012-12-09 NOTE — Telephone Encounter (Signed)
CSW was under the impression pt had been accepted into [employer] [employer] application.  Pt presented for [facility] appt, stating he has yet to receive CPAP.  CSW placed call to [facility] Rep to follow up, message left.

## 2012-12-09 NOTE — Addendum Note (Signed)
Addended by: Gust Rung on: 12/09/2012 08:21 PM   Modules accepted: Orders

## 2012-12-12 NOTE — Telephone Encounter (Signed)
Pt returned call to CSW. Pt notified of above.

## 2012-12-12 NOTE — Telephone Encounter (Signed)
CSW recv'd voice mail from [place], [employer] rep.  Angelica Chessman was under the impression Mr. Horrigan had received a free CPAP machine.  Angelica Chessman states she will investigate this more on 12/12/12, when she returns to the office.  CSW placed call to Mr. Barot.  Pt not in at this time, mother confirmed pt has yet to recv'd a CPAP.  Mother informed CSW is awaiting response from [facility].

## 2012-12-15 ENCOUNTER — Telehealth: Payer: Self-pay | Admitting: Licensed Clinical Social Worker

## 2012-12-15 NOTE — Telephone Encounter (Signed)
Placed call to M. Wooten, Tax adviser for [employer] to follow up on CPAP request as charity for Mr. Arthurs.  Mandy requesting CSW to refax referral and pt demographics.  CSW refaxed referral.

## 2012-12-20 NOTE — Telephone Encounter (Signed)
CSW placed call to Mr. Noone to determine outcome of charity CPAP with [facility].  Pt states [facility] requested $130 and [facility] was able to provide as a free charity service.  Pt states he is proceeding with [facility].  CSW signing off at this time.

## 2013-03-30 ENCOUNTER — Encounter: Payer: Self-pay | Admitting: Internal Medicine

## 2013-07-13 ENCOUNTER — Encounter: Payer: Self-pay | Admitting: Internal Medicine

## 2013-08-17 ENCOUNTER — Ambulatory Visit (INDEPENDENT_AMBULATORY_CARE_PROVIDER_SITE_OTHER): Payer: Self-pay | Admitting: Internal Medicine

## 2013-08-17 ENCOUNTER — Encounter: Payer: Self-pay | Admitting: Internal Medicine

## 2013-08-17 VITALS — BP 139/80 | HR 82 | Temp 98.5°F | Ht 72.0 in | Wt 295.7 lb

## 2013-08-17 DIAGNOSIS — A630 Anogenital (venereal) warts: Secondary | ICD-10-CM

## 2013-08-17 DIAGNOSIS — E785 Hyperlipidemia, unspecified: Secondary | ICD-10-CM

## 2013-08-17 DIAGNOSIS — G4733 Obstructive sleep apnea (adult) (pediatric): Secondary | ICD-10-CM

## 2013-08-17 DIAGNOSIS — E119 Type 2 diabetes mellitus without complications: Secondary | ICD-10-CM

## 2013-08-17 LAB — POCT GLYCOSYLATED HEMOGLOBIN (HGB A1C): HEMOGLOBIN A1C: 5.6

## 2013-08-17 LAB — GLUCOSE, CAPILLARY: Glucose-Capillary: 87 mg/dL (ref 70–99)

## 2013-08-17 MED ORDER — METFORMIN HCL ER 500 MG PO TB24: 500.0000 mg | ORAL_TABLET | Freq: Every day | ORAL | Status: DC

## 2013-08-17 MED ORDER — PRAVASTATIN SODIUM 20 MG PO TABS: 20.0000 mg | ORAL_TABLET | Freq: Every evening | ORAL | Status: DC

## 2013-08-17 NOTE — Progress Notes (Signed)
Case discussed with Dr. Hoffman at the time of the visit.  We reviewed the resident's history and exam and pertinent patient test results.  I agree with the assessment, diagnosis, and plan of care documented in the resident's note. 

## 2013-08-17 NOTE — Patient Instructions (Addendum)
Try the OTC freezing spray for wart. You can start taking metformin once a day. Please take pravastatin each night.  Practice Good Sleep Hygiene Here are some suggestions Avoid napping during the day. It can disturb the normal pattern of sleep and wakefulness.  Avoid stimulants such as caffeine, nicotine, and alcohol too close to bedtime. While alcohol is well known to speed the onset of sleep, it disrupts sleep in the second half as the body begins to metabolize the alcohol, causing arousal.  Exercise can promote good sleep. Vigorous exercise should be taken in the morning or late afternoon. A relaxing exercise, like yoga, can be done before bed to help initiate a restful night's sleep. Food can be disruptive right before sleep. Stay away from large meals close to bedtime. Also dietary changes can cause sleep problems, if someone is struggling with a sleep problem, it's not a good time to start experimenting with spicy dishes. And, remember, chocolate has caffeine.  Ensure adequate exposure to natural light. This is particularly important for older people who may not venture outside as frequently as children and adults. Light exposure helps maintain a healthy sleep-wake cycle.  Establish a regular relaxing bedtime routine. Try to avoid emotionally upsetting conversations and activities before trying to go to sleep. Don't dwell on, or bring your problems to bed.  Associate your bed with sleep. It's not a good idea to use your bed to watch TV, listen to the radio, or read.  Make sure that the sleep environment is pleasant and relaxing. The bed should be comfortable, the room should not be too hot or cold, or too bright.  You can try taking OTC melatonin for sleep if these things fail to help.

## 2013-08-17 NOTE — Progress Notes (Signed)
[facility] Subjective:   Patient ID: Brady Gibbs male   DOB: Oct 26, 1980 33 y.o.   MRN: [id number]  HPI: Brady Gibbs is a 33 y.o. male with a PMH significant for DM2, HLD, OSA.  He presents today for regular follow up.  He reports he has been taking Metformin 500mg  BID for diabetes.  He denies any blurry vision, polydipsia or polyuria.  He checks his feet every night and has not had any issues.  Since our last visit he has started with CPAP and reports increased energy and has lost almost 20 lbs.  He is sleeping much better with his CPAP although notes that sometimes when he gets a good night sleep he will have trouble falling asleep the next night.  He does note that about a year ago he was treated for genital warts with condylox gel, he notes that he had improvement in one wart but no regression of the larger wart.  He was unable to see a dermatologist due to lack of insurance.  He reports he has not longer been taking pravastatin for HLD but is not sure why he stopped taking it.  Past Medical History  Diagnosis Date  . Diabetes mellitus without complication    Current Outpatient Prescriptions  Medication Sig Dispense Refill  . acetaminophen (TYLENOL) 500 MG tablet Take 1,000 mg by mouth every 6 (six) hours as needed. For pain      . metFORMIN (GLUCOPHAGE) 500 MG tablet Take 2 tablets (1,000 mg total) by mouth 2 (two) times daily with a meal.  120 tablet  5  . pravastatin (PRAVACHOL) 40 MG tablet Take 0.5 tablets (20 mg total) by mouth every evening.  30 tablet  11   No current facility-administered medications for this visit.   No family history on file. History   Social History  . Marital Status: Married    Spouse Name: N/A    Number of Children: N/A  . Years of Education: 10   Social History Main Topics  . Smoking status: Current Every Day Smoker -- 0.50 packs/day    Types: Cigarettes  . Smokeless tobacco: Not on file     Comment: "Ready to give  it up". 1/2 PPD  . Alcohol Use: No     Comment: Very rarely.  . Drug Use: No  . Sexual Activity: Not Currently   Other Topics Concern  . Not on file   Social History Narrative  . No narrative on file   Review of Systems: Review of Systems  Constitutional: Negative for fever, chills and malaise/fatigue.  Eyes: Negative for blurred vision.  Respiratory: Negative for cough and shortness of breath.   Cardiovascular: Negative for chest pain and claudication.  Gastrointestinal: Negative for heartburn.  Genitourinary: Negative for dysuria.  Musculoskeletal: Negative for myalgias.  Neurological: Negative for dizziness and headaches.  Endo/Heme/Allergies: Negative for polydipsia.  Psychiatric/Behavioral: Negative for suicidal ideas.    Objective:  Physical Exam: Filed Vitals:   08/17/13 1504  BP: 139/80  Pulse: 82  Temp: 98.5 F (36.9 C)  TempSrc: Oral  Height: 6' (1.829 m)  Weight: 295 lb 11.2 oz (134.129 kg)  SpO2: 99%  Physical Exam  Nursing note and vitals reviewed. Constitutional: He is oriented to person, place, and time and well-developed, well-nourished, and in no distress.  obese  HENT:  Head: Normocephalic and atraumatic.  Cardiovascular: Normal rate, regular rhythm, normal heart sounds and intact distal pulses.   2+ dp and pt pulses bilaterally  Pulmonary/Chest: Effort normal and breath sounds normal.  Abdominal: Soft. Bowel sounds are normal. He exhibits no distension. There is no tenderness.  Musculoskeletal: He exhibits no edema.  Neurological: He is alert and oriented to person, place, and time.  Skin: Skin is warm and dry.     Psychiatric: Affect and judgment normal.    Assessment & Plan:  Case discussed with Dr. Aundria Rudogers See Problem Based Assessment and Plan Medications Ordered Meds ordered this encounter  Medications  . metFORMIN (GLUCOPHAGE XR) 500 MG 24 hr tablet    Sig: Take 1 tablet (500 mg total) by mouth daily with breakfast.    Dispense:  30  tablet    Refill:  11  . pravastatin (PRAVACHOL) 20 MG tablet    Sig: Take 1 tablet (20 mg total) by mouth every evening.    Dispense:  30 tablet    Refill:  11   Other Orders Orders Placed This Encounter  Procedures  . Glucose, capillary  . Microalbumin / creatinine urine ratio  . POCT glycosylated hemoglobin (Hb A1C)

## 2013-08-18 LAB — MICROALBUMIN / CREATININE URINE RATIO
Creatinine, Urine: 241.6 mg/dL
MICROALB/CREAT RATIO: 3.2 mg/g (ref 0.0–30.0)
Microalb, Ur: 0.78 mg/dL (ref 0.00–1.89)

## 2013-08-20 ENCOUNTER — Encounter: Payer: Self-pay | Admitting: Internal Medicine

## 2013-08-20 NOTE — Assessment & Plan Note (Addendum)
Lab Results  Component Value Date   HGBA1C 5.6 08/17/2013   HGBA1C 6.3 10/31/2012   HGBA1C 8.2 07/29/2012     Assessment: Diabetes control: good control (HgbA1C at goal) Progress toward A1C goal:  at goal Comments: patient has lost 20lbs currently taking only metformin 500mg  BID  Plan: Medications:  Decrease Metformin to 500mg  daily Home glucose monitoring: Frequency: no home glucose monitoring Timing: N/A Instruction/counseling given: reminded to bring medications to each visit, discussed foot care, discussed the need for weight loss and discussed diet Educational resources provided:   Self management tools provided:   Other plans: continue to encourage weight loss, exercises and healthy eating to hopefully be able to stop medication altogether.  He also reports he has never had Hep B series, he is willing to receive the series, will try to start this at his next visit.

## 2013-08-20 NOTE — Assessment & Plan Note (Signed)
Patient has failed treatment with Condylox.  Does not have insurance at this time and cannot afford dermatologist.  As the lesion in located in the groin area and not on the penile shaft we discussed he could try OTC freezing spray to try to remove the wart.

## 2013-08-20 NOTE — Assessment & Plan Note (Signed)
Patient now has CPAP and doing much better with sleep he now has some occasional insomnia from increased energy. We reviewed proper sleep hygine, he will try these techniques.

## 2013-08-20 NOTE — Assessment & Plan Note (Signed)
Given his history of DM, Smoking, Obesity he should be on at least a medium intensity statin.  He is willing to take pravastatin. - Will restart pravastatin and check lipid panel at next visit if he is taking the medication.

## 2014-02-02 ENCOUNTER — Encounter: Payer: Self-pay | Admitting: *Deleted

## 2014-03-05 ENCOUNTER — Encounter: Payer: Self-pay | Admitting: Internal Medicine

## 2014-05-24 ENCOUNTER — Telehealth: Payer: Self-pay | Admitting: Internal Medicine

## 2014-05-24 NOTE — Telephone Encounter (Signed)
Attempted to contact patient today about getting his HM visit scheduled.  Phone number on file belongs to his brother.  Said he would relay the message to the patient about getting in contact with us for an appt.

## 2014-08-08 ENCOUNTER — Telehealth: Payer: Self-pay | Admitting: Internal Medicine

## 2014-08-08 NOTE — Telephone Encounter (Signed)
Call to patient to confirm appointment for 08/09/14 at 2:15 lm with brother to call back to confirm

## 2014-08-09 ENCOUNTER — Ambulatory Visit (INDEPENDENT_AMBULATORY_CARE_PROVIDER_SITE_OTHER): Payer: 59 | Admitting: Internal Medicine

## 2014-08-09 ENCOUNTER — Encounter: Payer: Self-pay | Admitting: Internal Medicine

## 2014-08-09 VITALS — BP 118/76 | HR 92 | Temp 98.3°F | Ht 72.0 in | Wt 307.0 lb

## 2014-08-09 DIAGNOSIS — Z9989 Dependence on other enabling machines and devices: Secondary | ICD-10-CM

## 2014-08-09 DIAGNOSIS — G4700 Insomnia, unspecified: Secondary | ICD-10-CM

## 2014-08-09 DIAGNOSIS — B351 Tinea unguium: Secondary | ICD-10-CM

## 2014-08-09 DIAGNOSIS — G4733 Obstructive sleep apnea (adult) (pediatric): Secondary | ICD-10-CM | POA: Diagnosis not present

## 2014-08-09 DIAGNOSIS — Z6841 Body Mass Index (BMI) 40.0 and over, adult: Secondary | ICD-10-CM

## 2014-08-09 DIAGNOSIS — E119 Type 2 diabetes mellitus without complications: Secondary | ICD-10-CM | POA: Diagnosis not present

## 2014-08-09 DIAGNOSIS — E785 Hyperlipidemia, unspecified: Secondary | ICD-10-CM | POA: Diagnosis not present

## 2014-08-09 DIAGNOSIS — A630 Anogenital (venereal) warts: Secondary | ICD-10-CM | POA: Diagnosis not present

## 2014-08-09 DIAGNOSIS — F17210 Nicotine dependence, cigarettes, uncomplicated: Secondary | ICD-10-CM

## 2014-08-09 LAB — COMPLETE METABOLIC PANEL WITH GFR
ALK PHOS: 38 U/L — AB (ref 39–117)
ALT: 35 U/L (ref 0–53)
AST: 22 U/L (ref 0–37)
Albumin: 4.6 g/dL (ref 3.5–5.2)
BILIRUBIN TOTAL: 0.3 mg/dL (ref 0.2–1.2)
BUN: 14 mg/dL (ref 6–23)
CO2: 22 meq/L (ref 19–32)
CREATININE: 1.11 mg/dL (ref 0.50–1.35)
Calcium: 9.5 mg/dL (ref 8.4–10.5)
Chloride: 101 mEq/L (ref 96–112)
GFR, Est Non African American: 85 mL/min
Glucose, Bld: 93 mg/dL (ref 70–99)
Potassium: 4.1 mEq/L (ref 3.5–5.3)
Sodium: 137 mEq/L (ref 135–145)
Total Protein: 7.1 g/dL (ref 6.0–8.3)

## 2014-08-09 LAB — LIPID PANEL
CHOLESTEROL: 257 mg/dL — AB (ref 0–200)
HDL: 29 mg/dL — ABNORMAL LOW (ref >=40)
LDL Cholesterol: 191 mg/dL — ABNORMAL HIGH (ref 0–99)
TRIGLYCERIDES: 185 mg/dL — AB (ref <150)
Total CHOL/HDL Ratio: 8.9 Ratio
VLDL: 37 mg/dL (ref 0–40)

## 2014-08-09 LAB — GLUCOSE, CAPILLARY: Glucose-Capillary: 112 mg/dL — ABNORMAL HIGH (ref 70–99)

## 2014-08-09 LAB — POCT GLYCOSYLATED HEMOGLOBIN (HGB A1C): HEMOGLOBIN A1C: 6.7

## 2014-08-09 LAB — HM DIABETES EYE EXAM

## 2014-08-09 MED ORDER — METFORMIN HCL ER 500 MG PO TB24: 500.0000 mg | ORAL_TABLET | Freq: Every day | ORAL | Status: DC

## 2014-08-09 MED ORDER — AMITRIPTYLINE HCL 25 MG PO TABS: 25.0000 mg | ORAL_TABLET | Freq: Every evening | ORAL | Status: DC | PRN

## 2014-08-09 MED ORDER — PANTOPRAZOLE SODIUM 40 MG PO TBEC: 40.0000 mg | DELAYED_RELEASE_TABLET | Freq: Every day | ORAL | Status: DC

## 2014-08-09 MED ORDER — PODOFILOX 0.5 % EX GEL: Freq: Two times a day (BID) | CUTANEOUS | Status: DC

## 2014-08-09 MED ORDER — TERBINAFINE HCL 250 MG PO TABS: 250.0000 mg | ORAL_TABLET | Freq: Every day | ORAL | Status: DC

## 2014-08-09 NOTE — Progress Notes (Signed)
Medicine attending: Medical history, presenting problems, physical findings, and medications, reviewed with Dr Erik Hoffman and I concur with his evaluation and management plan. 

## 2014-08-09 NOTE — Patient Instructions (Signed)
General Instructions:   Please bring your medicines with you each time you come to clinic.  Medicines may include prescription medications, over-the-counter medications, herbal remedies, eye drops, vitamins, or other pills.   Progress Toward Treatment Goals:  Treatment Goal 08/17/2013  Hemoglobin A1C at goal  Stop smoking stopped smoking  Prevent falls -    Self Care Goals & Plans:  Self Care Goal 08/09/2014  Manage my medications take my medicines as prescribed; bring my medications to every visit  Monitor my health check my feet daily  Eat healthy foods eat more vegetables; eat foods that are low in salt; eat baked foods instead of fried foods; drink diet soda or water instead of juice or soda  Be physically active take a walk every day  Stop smoking (No Data)  Prevent falls -  Other -  Meeting treatment goals -    Home Blood Glucose Monitoring 08/17/2013  Check my blood sugar no home glucose monitoring  When to check my blood sugar N/A     Care Management & Community Referrals:  Referral 08/17/2013  Referrals made for care management support none needed  Referrals made to community resources -        Practice Good Sleep Hygiene Here are some suggestions Avoid napping during the day. It can disturb the normal pattern of sleep and wakefulness.  Avoid stimulants such as caffeine, nicotine, and alcohol too close to bedtime. While alcohol is well known to speed the onset of sleep, it disrupts sleep in the second half as the body begins to metabolize the alcohol, causing arousal.  Exercise can promote good sleep. Vigorous exercise should be taken in the morning or late afternoon. A relaxing exercise, like yoga, can be done before bed to help initiate a restful night's sleep. Food can be disruptive right before sleep. Stay away from large meals close to bedtime. Also dietary changes can cause sleep problems, if someone is struggling with a sleep problem, it's not a good time to  start experimenting with spicy dishes. And, remember, chocolate has caffeine.  Ensure adequate exposure to natural light. This is particularly important for older people who may not venture outside as frequently as children and adults. Light exposure helps maintain a healthy sleep-wake cycle.  Establish a regular relaxing bedtime routine. Try to avoid emotionally upsetting conversations and activities before trying to go to sleep. Don't dwell on, or bring your problems to bed.  Associate your bed with sleep. It's not a good idea to use your bed to watch TV, listen to the radio, or read.  Make sure that the sleep environment is pleasant and relaxing. The bed should be comfortable, the room should not be too hot or cold, or too bright.

## 2014-08-10 DIAGNOSIS — G4700 Insomnia, unspecified: Secondary | ICD-10-CM | POA: Insufficient documentation

## 2014-08-10 DIAGNOSIS — B351 Tinea unguium: Secondary | ICD-10-CM | POA: Insufficient documentation

## 2014-08-10 DIAGNOSIS — Z6841 Body Mass Index (BMI) 40.0 and over, adult: Secondary | ICD-10-CM | POA: Insufficient documentation

## 2014-08-10 MED ORDER — ATORVASTATIN CALCIUM 40 MG PO TABS: 40.0000 mg | ORAL_TABLET | Freq: Every day | ORAL | Status: DC

## 2014-08-10 NOTE — Assessment & Plan Note (Signed)
-  Will check CMP for baseline LFTs and repeat after 6 weeks of treatment with terbinafine.  Can continue for up to 12 weeks.

## 2014-08-10 NOTE — Assessment & Plan Note (Signed)
-   Discussed weight loss, he is currently starting a workout routine, now that he has insurance he may be a candidate for weight loss surgery given his BMI and weight related comorbidities.

## 2014-08-10 NOTE — Assessment & Plan Note (Signed)
-   Recheck Lipid panel  - LDL returned at 191. - Will d/c pravastatin and start atorvastatin 40mg  daily.  Lab Results  Component Value Date   CHOL 257* 08/09/2014   CHOL 202* 09/27/2012   Lab Results  Component Value Date   HDL 29* 08/09/2014   HDL 27* 09/27/2012   Lab Results  Component Value Date   LDLCALC 191* 08/09/2014   LDLCALC 111* 09/27/2012   Lab Results  Component Value Date   TRIG 185* 08/09/2014   TRIG 321* 09/27/2012   Lab Results  Component Value Date   CHOLHDL 8.9 08/09/2014   CHOLHDL 7.5 09/27/2012   No results found for: LDLDIRECT

## 2014-08-10 NOTE — Assessment & Plan Note (Addendum)
Lab Results  Component Value Date   HGBA1C 6.7 08/09/2014   HGBA1C 5.6 08/17/2013   HGBA1C 6.3 10/31/2012     Assessment: Diabetes control: good control (HgbA1C at goal) Progress toward A1C goal:  at goal Comments: had stopped taking metformin 500mg  XR daily  Plan: Medications:  Resume Metformin 500mg  XR daily Home glucose monitoring: Frequency: no home glucose monitoring Timing: N/A Instruction/counseling given: reminded to get eye exam, reminded to bring blood glucose meter & log to each visit, reminded to bring medications to each visit, discussed the need for weight loss and discussed diet Educational resources provided: handout Self management tools provided:   Other plans: Have stricter goal of 6.5, stressed weight loss.

## 2014-08-10 NOTE — Assessment & Plan Note (Signed)
Stable, continue CPAP

## 2014-08-10 NOTE — Assessment & Plan Note (Signed)
-  Patient is using CPAP, has tried Benadryl, Melatonin, and Trazodone in the past without success. - We discussed proper sleep hygeine. - Will try low dose amitriptyline.

## 2014-08-10 NOTE — Assessment & Plan Note (Signed)
-   Was apparently not able to afford the condylox Tx last year. - Will reperscribe now that he has insurance.

## 2014-08-10 NOTE — Progress Notes (Signed)
[facility] Subjective:   Patient ID: Brady Gibbs male   DOB: [birth year] 33 y.o.   MRN: [id number]  HPI: Brady Gibbs is a 34 y.o. male with a PMH detailed below who presents for overdue follow up for diabetes.  T2DM:  He has not been seen in a year.  He reports this was due to lack of insurance.  He reports he is doing well though and has stopped taking metformin because he felt so well.  He has started to exercise and feels he is loosing some weight.  HLD: reports taking Pravastatin daily, he has stopped eating fried food and fast foods.  He does report heartburn, epigastric pain and occasionally the sensation that food or liquids is traveling slowly down his esophageus.  He has previously been on a PPI but this was stopped as his symptoms dissipated.  He does report insomnia, please see A&P below for further details.  Past Medical History  Diagnosis Date  . Diabetes mellitus without complication    Current Outpatient Prescriptions  Medication Sig Dispense Refill  . amitriptyline (ELAVIL) 25 MG tablet Take 1 tablet (25 mg total) by mouth at bedtime as needed for sleep. 30 tablet 5  . atorvastatin (LIPITOR) 40 MG tablet Take 1 tablet (40 mg total) by mouth daily. 90 tablet 3  . metFORMIN (GLUCOPHAGE XR) 500 MG 24 hr tablet Take 1 tablet (500 mg total) by mouth daily with breakfast. 90 tablet 3  . pantoprazole (PROTONIX) 40 MG tablet Take 1 tablet (40 mg total) by mouth daily. 30 tablet 5  . podofilox (CONDYLOX) 0.5 % gel Apply topically 2 (two) times daily. Apply for 3 consecutive days followed by 4 days without treatment. Repeat as needed for up to 4 weeks. 3.5 g 1  . terbinafine (LAMISIL) 250 MG tablet Take 1 tablet (250 mg total) by mouth daily. 42 tablet 1   No current facility-administered medications for this visit.   No family history on file. History   Social History  . Marital Status: Married    Spouse Name: N/A  . Number of Children: N/A  .  Years of Education: 10   Social History Main Topics  . Smoking status: Current Every Day Smoker -- 0.50 packs/day    Types: Cigarettes  . Smokeless tobacco: Not on file     Comment: "Ready to give it up".5 CIAGRETTS PER DAY  . Alcohol Use: No     Comment: Very rarely.  . Drug Use: No  . Sexual Activity: Not Currently   Other Topics Concern  . None   Social History Narrative   Review of Systems: Review of Systems  Constitutional: Negative for fever, chills, weight loss and malaise/fatigue.  Eyes: Negative for blurred vision.  Respiratory: Negative for cough and shortness of breath.   Cardiovascular: Negative for chest pain and leg swelling.  Gastrointestinal: Negative for heartburn and abdominal pain.  Genitourinary: Negative for dysuria.  Musculoskeletal: Negative for myalgias.  Neurological: Negative for dizziness and headaches.  Endo/Heme/Allergies: Negative for polydipsia.  Psychiatric/Behavioral: Negative for substance abuse.     Objective:  Physical Exam: Filed Vitals:   08/09/14 1514 08/09/14 1551  BP: 164/86 118/76  Pulse: 92   Temp: 98.3 F (36.8 C)   TempSrc: Oral   Height: 6' (1.829 m)   Weight: 307 lb (139.254 kg)   SpO2: 99%   Physical Exam  Constitutional: He is oriented to person, place, and time and well-developed, well-nourished, and in no distress.  obese  HENT:  Head: Normocephalic and atraumatic.  Eyes: Conjunctivae are normal.  Cardiovascular: Normal rate and regular rhythm.   Pulmonary/Chest: Effort normal and breath sounds normal.  Abdominal: Soft. There is tenderness (epigastric).  Genitourinary:  Condyloma on right inguinal region  Musculoskeletal: He exhibits no edema.  Neurological: He is alert and oriented to person, place, and time.  Skin:  onchymycosis of right great toe.    Psychiatric: Affect normal.  Nursing note and vitals reviewed.   Assessment & Plan:  Case discussed with Dr. Cyndie ChimeGranfortuna  Insomnia -Patient is using  CPAP, has tried Benadryl, Melatonin, and Trazodone in the past without success. - We discussed proper sleep hygeine. - Will try low dose amitriptyline.   Hyperlipidemia - Recheck Lipid panel  - LDL returned at 191. - Will d/c pravastatin and start atorvastatin 40mg  daily.  Lab Results  Component Value Date   CHOL 257* 08/09/2014   CHOL 202* 09/27/2012   Lab Results  Component Value Date   HDL 29* 08/09/2014   HDL 27* 09/27/2012   Lab Results  Component Value Date   LDLCALC 191* 08/09/2014   LDLCALC 111* 09/27/2012   Lab Results  Component Value Date   TRIG 185* 08/09/2014   TRIG 321* 09/27/2012   Lab Results  Component Value Date   CHOLHDL 8.9 08/09/2014   CHOLHDL 7.5 09/27/2012   No results found for: LDLDIRECT    Diabetes mellitus, type 2 Lab Results  Component Value Date   HGBA1C 6.7 08/09/2014   HGBA1C 5.6 08/17/2013   HGBA1C 6.3 10/31/2012     Assessment: Diabetes control: good control (HgbA1C at goal) Progress toward A1C goal:  at goal Comments: had stopped taking metformin 500mg  XR daily  Plan: Medications:  Resume Metformin 500mg  XR daily Home glucose monitoring: Frequency: no home glucose monitoring Timing: N/A Instruction/counseling given: reminded to get eye exam, reminded to bring blood glucose meter & log to each visit, reminded to bring medications to each visit, discussed the need for weight loss and discussed diet Educational resources provided: handout Self management tools provided:   Other plans: Have stricter goal of 6.5, stressed weight loss.       Condyloma acuminata - Was apparently not able to afford the condylox Tx last year. - Will reperscribe now that he has insurance.   Severe obstructive sleep apnea - Stable, continue CPAP   Onychomycosis of right great toe -Will check CMP for baseline LFTs and repeat after 6 weeks of treatment with terbinafine.  Can continue for up to 12 weeks.   Severe obesity (BMI >=  40) - Discussed weight loss, he is currently starting a workout routine, now that he has insurance he may be a candidate for weight loss surgery given his BMI and weight related comorbidities.     Medications Ordered Meds ordered this encounter  Medications  . amitriptyline (ELAVIL) 25 MG tablet    Sig: Take 1 tablet (25 mg total) by mouth at bedtime as needed for sleep.    Dispense:  30 tablet    Refill:  5  . pantoprazole (PROTONIX) 40 MG tablet    Sig: Take 1 tablet (40 mg total) by mouth daily.    Dispense:  30 tablet    Refill:  5  . terbinafine (LAMISIL) 250 MG tablet    Sig: Take 1 tablet (250 mg total) by mouth daily.    Dispense:  42 tablet    Refill:  1  . metFORMIN (GLUCOPHAGE XR) 500 MG  24 hr tablet    Sig: Take 1 tablet (500 mg total) by mouth daily with breakfast.    Dispense:  90 tablet    Refill:  3  . podofilox (CONDYLOX) 0.5 % gel    Sig: Apply topically 2 (two) times daily. Apply for 3 consecutive days followed by 4 days without treatment. Repeat as needed for up to 4 weeks.    Dispense:  3.5 g    Refill:  1  . atorvastatin (LIPITOR) 40 MG tablet    Sig: Take 1 tablet (40 mg total) by mouth daily.    Dispense:  90 tablet    Refill:  3    Please discontinue Pravastatin.   Other Orders Orders Placed This Encounter  Procedures  . Glucose, capillary  . Lipid Profile  . CMP with Estimated GFR (CPT-80053)  . CMP with Estimated GFR (CPT-80053)    Standing Status: Future     Number of Occurrences:      Standing Expiration Date: 09/25/2014  . POC Hbg A1C  . Retinal/fundus photography    Standing Status: Future     Number of Occurrences: 1     Standing Expiration Date: 08/09/2015

## 2014-08-17 ENCOUNTER — Encounter: Payer: Self-pay | Admitting: *Deleted

## 2014-09-12 ENCOUNTER — Ambulatory Visit: Payer: 59 | Admitting: Internal Medicine

## 2014-09-12 ENCOUNTER — Ambulatory Visit (INDEPENDENT_AMBULATORY_CARE_PROVIDER_SITE_OTHER): Payer: 59 | Admitting: Internal Medicine

## 2014-09-12 ENCOUNTER — Encounter: Payer: Self-pay | Admitting: Internal Medicine

## 2014-09-12 VITALS — BP 118/85 | HR 99 | Temp 98.4°F | Ht 72.0 in | Wt 306.5 lb

## 2014-09-12 DIAGNOSIS — G4700 Insomnia, unspecified: Secondary | ICD-10-CM | POA: Diagnosis not present

## 2014-09-12 DIAGNOSIS — E119 Type 2 diabetes mellitus without complications: Secondary | ICD-10-CM

## 2014-09-12 LAB — GLUCOSE, CAPILLARY: Glucose-Capillary: 76 mg/dL (ref 65–99)

## 2014-09-12 LAB — TSH: TSH: 2.378 u[IU]/mL (ref 0.350–4.500)

## 2014-09-12 MED ORDER — PAROXETINE HCL 20 MG PO TABS: 20.0000 mg | ORAL_TABLET | Freq: Every day | ORAL | Status: DC

## 2014-09-12 MED ORDER — ZOLPIDEM TARTRATE ER 12.5 MG PO TBCR: 12.5000 mg | EXTENDED_RELEASE_TABLET | Freq: Every evening | ORAL | Status: DC | PRN

## 2014-09-12 NOTE — Patient Instructions (Signed)
You have a prescription for two new medications to try to help with your sleep. The first is called zolpidem and should help you fall asleep. The second is Paxil -- this may take 4-6 weeks before you notice any changes; it may help to reduce your racing thoughts.   IT IS VERY IMPORTANT THAT YOU CONTINUE TO USE YOUR CPAP WHILE TAKING THESE MEDICATIONS.  General Instructions:   Please bring your medicines with you each time you come to clinic.  Medicines may include prescription medications, over-the-counter medications, herbal remedies, eye drops, vitamins, or other pills.   Progress Toward Treatment Goals:  Treatment Goal 08/09/2014  Hemoglobin A1C at goal  Stop smoking smoking less  Prevent falls -    Self Care Goals & Plans:  Self Care Goal 09/12/2014  Manage my medications take my medicines as prescribed; bring my medications to every visit; refill my medications on time  Monitor my health bring my glucose meter and log to each visit; keep track of my blood glucose; check my feet daily  Eat healthy foods eat more vegetables; eat foods that are low in salt; drink diet soda or water instead of juice or soda  Be physically active take a walk every day  Stop smoking go to the [employer] website ([email or web address])  Prevent falls -  Other -  Meeting treatment goals -    Home Blood Glucose Monitoring 08/09/2014  Check my blood sugar no home glucose monitoring  When to check my blood sugar N/A     Care Management & Community Referrals:  Referral 08/09/2014  Referrals made for care management support none needed  Referrals made to community resources -

## 2014-09-14 NOTE — Progress Notes (Signed)
[facility] Subjective:   Patient ID: Brady Gibbs male   DOB: 1980-05-14 34 y.o.   MRN: [id number]  HPI: Brady Gibbs is a 34 y.o. male with a history of DM2, OSA, HLD and insomnia who presents for evaluation of persistent insomnia. He first experienced insomnia 6-7 years ago. At that time, he underwent a sleep study and was found to have OSA. He was started on CPAP, which he uses every night. He has continued to struggle with sleep, and has found that his sleep has been even more difficult over the past 5-6 months. He has tried benadryl, melatonin, trazodone and amitriptylene, but nothing has helped. He finds that it is difficult for him to fall asleep and difficult for him to stay asleep. The situation is so terrible, that he has applied for disability, but he would like to start sleeping and resume work.  He describes a fair amount of anxiety when it comes to breathing at night. This started when he first was told he needed CPAP at night. As a result, he sleeps on the couch with about 4 pillows to keep himself somewhat upright. He is in a wing of the house without anyone else and sleeps without TV, radio or lights on. He eats 3 hours before bedtime and does not use any caffeine, alcohol or illicit drugs (other than marijuana on occasion). He has found that he can sleep for several hours during the day.   Past Medical History  Diagnosis Date  . Diabetes mellitus without complication    Current Outpatient Prescriptions  Medication Sig Dispense Refill  . amitriptyline (ELAVIL) 25 MG tablet Take 1 tablet (25 mg total) by mouth at bedtime as needed for sleep. 30 tablet 5  . atorvastatin (LIPITOR) 40 MG tablet Take 1 tablet (40 mg total) by mouth daily. 90 tablet 3  . metFORMIN (GLUCOPHAGE XR) 500 MG 24 hr tablet Take 1 tablet (500 mg total) by mouth daily with breakfast. 90 tablet 3  . pantoprazole (PROTONIX) 40 MG tablet Take 1 tablet (40 mg total) by mouth daily. 30  tablet 5  . PARoxetine (PAXIL) 20 MG tablet Take 1 tablet (20 mg total) by mouth daily. 30 tablet 1  . podofilox (CONDYLOX) 0.5 % gel Apply topically 2 (two) times daily. Apply for 3 consecutive days followed by 4 days without treatment. Repeat as needed for up to 4 weeks. 3.5 g 1  . terbinafine (LAMISIL) 250 MG tablet Take 1 tablet (250 mg total) by mouth daily. 42 tablet 1  . zolpidem (AMBIEN CR) 12.5 MG CR tablet Take 1 tablet (12.5 mg total) by mouth at bedtime as needed for sleep. 30 tablet 1   No current facility-administered medications for this visit.   No family history on file. History   Social History  . Marital Status: Married    Spouse Name: N/A  . Number of Children: N/A  . Years of Education: 10   Social History Main Topics  . Smoking status: Current Every Day Smoker -- 0.50 packs/day    Types: Cigarettes  . Smokeless tobacco: Not on file     Comment: "Ready to give it up".5 CIAGRETTS PER DAY  . Alcohol Use: No     Comment: Very rarely.  . Drug Use: No  . Sexual Activity: Not Currently   Other Topics Concern  . None   Social History Narrative   Review of Systems: General: no recent illness, no weight changes,  but continued difficulty sleeping  Skin: no rashes or lesions HEENT: no headaches or blurred vision Cardiac: no chest pain or palpitations Respiratory: no shortness of breath GI: no changes Urinary: no changes Msk: no changes Psychiatric: anxious  Objective:  Physical Exam: Filed Vitals:   09/12/14 1613  BP: 118/85  Pulse: 99  Temp: 98.4 F (36.9 C)  TempSrc: Oral  Height: 6' (1.829 m)  Weight: 306 lb 8 oz (139.027 kg)  SpO2: 100%   Appearance: in NAD HEENT: AT/[place], PERRL, EOMi, thick neck, no thyromegaly, no lymphadenopathy Heart: RRR, normal S1S2, no murmurs Lungs: CTAB, no wheezes or rales Abdomen: obese abdomen, BS+, soft, nontender Musculoskeletal: normal range of motion Extremities: no edema Neurologic: A&Ox3, grossly  intact Skin: no rashes or lesions Psychiatric: appears anxious  Assessment & Plan:  Case discussed with Dr. Josem KaufmannKlima.  Insomnia Patient has struggled with insomnia for over 5 years and has failed therapy with Benadryl, Melatonin, Trazodone and Amitriptyline. Uses CPAP every night. Describes good sleep hygiene other than napping during the day. Will initiate Paxil in the context of the patient's anxiety toward sleeping and Zolpidem to help initiate sleep.   - Stop amitriptyline - Stop napping during the day - CONTINUE TO USE CPAP NIGHTLY (the importance of this was emphasized with new medications) - Paxil 20 mg by mouth daily; patient made aware that this takes some time to start working - Zolpidem 12.5 mg by mouth at bedtime PRN for sleep - Return in 4-6 weeks     Medications Ordered Meds ordered this encounter  Medications  . zolpidem (AMBIEN CR) 12.5 MG CR tablet    Sig: Take 1 tablet (12.5 mg total) by mouth at bedtime as needed for sleep.    Dispense:  30 tablet    Refill:  1  . PARoxetine (PAXIL) 20 MG tablet    Sig: Take 1 tablet (20 mg total) by mouth daily.    Dispense:  30 tablet    Refill:  1   Other Orders Orders Placed This Encounter  Procedures  . Glucose, capillary  . TSH

## 2014-09-14 NOTE — Assessment & Plan Note (Addendum)
Patient has struggled with insomnia for over 5 years and has failed therapy with Benadryl, Melatonin, Trazodone and Amitriptyline. Uses CPAP every night. Describes good sleep hygiene other than napping during the day. Will initiate Paxil in the context of the patient's anxiety toward sleeping and Zolpidem to help initiate sleep.   - Stop amitriptyline - Stop napping during the day - CONTINUE TO USE CPAP NIGHTLY (the importance of this was emphasized with new medications) - Paxil 20 mg by mouth daily; patient made aware that this takes some time to start working - Zolpidem 12.5 mg by mouth at bedtime PRN for sleep - Return in 4-6 weeks - TSH today   Addendum: TSH WNL. Patient was contacted and told of normal TSH results.

## 2014-09-18 NOTE — Progress Notes (Signed)
I saw and evaluated the patient. I personally confirmed the key portions of Dr. Vivien RossettiMallory's history and exam and reviewed pertinent patient test results. The assessment, diagnosis, and plan were formulated together and I agree with the documentation in the resident's note.  Sleep hygiene remains an issue with daytime napping.  He was advised to avoid this as much as possible.  He also mentioned racing thoughts that preclude him from sleeping at night.  Zoloft was ineffective int he past for anxiety.  We therefore started Paxil and will reassess efficacy in controlling racing thoughts, and thus hopefully improving sleep.

## 2015-01-01 ENCOUNTER — Inpatient Hospital Stay (HOSPITAL_BASED_OUTPATIENT_CLINIC_OR_DEPARTMENT_OTHER)
Admission: EM | Admit: 2015-01-01 | Discharge: 2015-01-03 | DRG: 638 | Disposition: A | Discharge status: Home / Self Care | Payer: 59 | Attending: Oncology | Admitting: Oncology

## 2015-01-01 ENCOUNTER — Encounter (HOSPITAL_BASED_OUTPATIENT_CLINIC_OR_DEPARTMENT_OTHER): Payer: Self-pay

## 2015-01-01 DIAGNOSIS — G4733 Obstructive sleep apnea (adult) (pediatric): Secondary | ICD-10-CM | POA: Diagnosis present

## 2015-01-01 DIAGNOSIS — E872 Acidosis, unspecified: Secondary | ICD-10-CM | POA: Insufficient documentation

## 2015-01-01 DIAGNOSIS — N179 Acute kidney failure, unspecified: Secondary | ICD-10-CM | POA: Diagnosis present

## 2015-01-01 DIAGNOSIS — B3781 Candidal esophagitis: Secondary | ICD-10-CM | POA: Diagnosis present

## 2015-01-01 DIAGNOSIS — R000 Tachycardia, unspecified: Secondary | ICD-10-CM

## 2015-01-01 DIAGNOSIS — E785 Hyperlipidemia, unspecified: Secondary | ICD-10-CM | POA: Diagnosis present

## 2015-01-01 DIAGNOSIS — K219 Gastro-esophageal reflux disease without esophagitis: Secondary | ICD-10-CM | POA: Diagnosis present

## 2015-01-01 DIAGNOSIS — Z794 Long term (current) use of insulin: Secondary | ICD-10-CM | POA: Diagnosis not present

## 2015-01-01 DIAGNOSIS — K2900 Acute gastritis without bleeding: Secondary | ICD-10-CM | POA: Diagnosis not present

## 2015-01-01 DIAGNOSIS — R631 Polydipsia: Secondary | ICD-10-CM | POA: Diagnosis present

## 2015-01-01 DIAGNOSIS — E1110 Type 2 diabetes mellitus with ketoacidosis without coma: Secondary | ICD-10-CM | POA: Diagnosis present

## 2015-01-01 DIAGNOSIS — F17210 Nicotine dependence, cigarettes, uncomplicated: Secondary | ICD-10-CM | POA: Diagnosis present

## 2015-01-01 DIAGNOSIS — B370 Candidal stomatitis: Secondary | ICD-10-CM | POA: Diagnosis present

## 2015-01-01 DIAGNOSIS — Z79899 Other long term (current) drug therapy: Secondary | ICD-10-CM | POA: Diagnosis not present

## 2015-01-01 DIAGNOSIS — K2970 Gastritis, unspecified, without bleeding: Secondary | ICD-10-CM | POA: Diagnosis present

## 2015-01-01 DIAGNOSIS — E1310 Other specified diabetes mellitus with ketoacidosis without coma: Principal | ICD-10-CM

## 2015-01-01 DIAGNOSIS — B379 Candidiasis, unspecified: Secondary | ICD-10-CM | POA: Diagnosis not present

## 2015-01-01 DIAGNOSIS — Z9989 Dependence on other enabling machines and devices: Secondary | ICD-10-CM

## 2015-01-01 LAB — URINALYSIS, ROUTINE W REFLEX MICROSCOPIC
Bilirubin Urine: NEGATIVE
Glucose, UA: >1000 mg/dL — AB
Ketones, ur: >80 mg/dL — AB
Leukocytes, UA: NEGATIVE
NITRITE: NEGATIVE
Protein, ur: NEGATIVE mg/dL
SPECIFIC GRAVITY, URINE: 1.03 (ref 1.005–1.030)
UROBILINOGEN UA: 0.2 mg/dL (ref 0.0–1.0)
pH: 5 (ref 5.0–8.0)

## 2015-01-01 LAB — CBC WITH DIFFERENTIAL/PLATELET
BASOS ABS: 0 10*3/uL (ref 0.0–0.1)
BASOS PCT: 0 % (ref 0–1)
Eosinophils Absolute: 0 10*3/uL (ref 0.0–0.7)
Eosinophils Relative: 0 % (ref 0–5)
HEMATOCRIT: 53.6 % — AB (ref 39.0–52.0)
HEMOGLOBIN: 17.6 g/dL — AB (ref 13.0–17.0)
LYMPHS PCT: 12 % (ref 12–46)
Lymphs Abs: 1.2 10*3/uL (ref 0.7–4.0)
MCH: 27.5 pg (ref 26.0–34.0)
MCHC: 32.8 g/dL (ref 30.0–36.0)
MCV: 83.8 fL (ref 78.0–100.0)
MONOS PCT: 5 % (ref 3–12)
Monocytes Absolute: 0.5 10*3/uL (ref 0.1–1.0)
NEUTROS ABS: 8.4 10*3/uL — AB (ref 1.7–7.7)
NEUTROS PCT: 83 % — AB (ref 43–77)
Platelets: 276 10*3/uL (ref 150–400)
RBC: 6.4 MIL/uL — ABNORMAL HIGH (ref 4.22–5.81)
RDW: 13.3 % (ref 11.5–15.5)
WBC: 10 10*3/uL (ref 4.0–10.5)

## 2015-01-01 LAB — COMPREHENSIVE METABOLIC PANEL
ALBUMIN: 4.7 g/dL (ref 3.5–5.0)
ALK PHOS: 75 U/L (ref 38–126)
ALT: 29 U/L (ref 17–63)
ANION GAP: 26 — AB (ref 5–15)
AST: 16 U/L (ref 15–41)
BUN: 29 mg/dL — ABNORMAL HIGH (ref 6–20)
CALCIUM: 10.1 mg/dL (ref 8.9–10.3)
CO2: 11 mmol/L — AB (ref 22–32)
Chloride: 86 mmol/L — ABNORMAL LOW (ref 101–111)
Creatinine, Ser: 1.94 mg/dL — ABNORMAL HIGH (ref 0.61–1.24)
GFR calc non Af Amer: 43 mL/min — ABNORMAL LOW (ref >60)
GFR, EST AFRICAN AMERICAN: 50 mL/min — AB (ref >60)
Glucose, Bld: 802 mg/dL (ref 65–99)
POTASSIUM: 5.7 mmol/L — AB (ref 3.5–5.1)
SODIUM: 123 mmol/L — AB (ref 135–145)
Total Bilirubin: 1.6 mg/dL — ABNORMAL HIGH (ref 0.3–1.2)
Total Protein: 8.4 g/dL — ABNORMAL HIGH (ref 6.5–8.1)

## 2015-01-01 LAB — I-STAT VENOUS BLOOD GAS, ED
Acid-base deficit: 16 mmol/L — ABNORMAL HIGH (ref 0.0–2.0)
Bicarbonate: 9.9 mEq/L — ABNORMAL LOW (ref 20.0–24.0)
O2 Saturation: 94 %
PCO2 VEN: 25.5 mmHg — AB (ref 45.0–50.0)
PH VEN: 7.195 — AB (ref 7.250–7.300)
Patient temperature: 98.4
TCO2: 11 mmol/L (ref 0–100)
pO2, Ven: 83 mmHg — ABNORMAL HIGH (ref 30.0–45.0)

## 2015-01-01 LAB — BASIC METABOLIC PANEL
ANION GAP: 19 — AB (ref 5–15)
Anion gap: 14 (ref 5–15)
BUN: 26 mg/dL — ABNORMAL HIGH (ref 6–20)
BUN: 19 mg/dL (ref 6–20)
CALCIUM: 8.4 mg/dL — AB (ref 8.9–10.3)
CHLORIDE: 99 mmol/L — AB (ref 101–111)
CO2: 12 mmol/L — ABNORMAL LOW (ref 22–32)
CO2: 16 mmol/L — AB (ref 22–32)
CREATININE: 1.5 mg/dL — AB (ref 0.61–1.24)
Calcium: 8.4 mg/dL — ABNORMAL LOW (ref 8.9–10.3)
Chloride: 98 mmol/L — ABNORMAL LOW (ref 101–111)
Creatinine, Ser: 1.49 mg/dL — ABNORMAL HIGH (ref 0.61–1.24)
GFR calc Af Amer: >60 mL/min (ref >60)
GFR calc non Af Amer: 58 mL/min — ABNORMAL LOW (ref >60)
GFR, EST NON AFRICAN AMERICAN: 59 mL/min — AB (ref >60)
GLUCOSE: 457 mg/dL — AB (ref 65–99)
Glucose, Bld: 242 mg/dL — ABNORMAL HIGH (ref 65–99)
POTASSIUM: 4.6 mmol/L (ref 3.5–5.1)
Potassium: 4.3 mmol/L (ref 3.5–5.1)
SODIUM: 129 mmol/L — AB (ref 135–145)
SODIUM: 129 mmol/L — AB (ref 135–145)

## 2015-01-01 LAB — CBG MONITORING, ED
GLUCOSE-CAPILLARY: 448 mg/dL — AB (ref 65–99)
GLUCOSE-CAPILLARY: 380 mg/dL — AB (ref 65–99)
Glucose-Capillary: >600 mg/dL (ref 65–99)

## 2015-01-01 LAB — GLUCOSE, CAPILLARY
GLUCOSE-CAPILLARY: 247 mg/dL — AB (ref 65–99)
GLUCOSE-CAPILLARY: 240 mg/dL — AB (ref 65–99)
Glucose-Capillary: 257 mg/dL — ABNORMAL HIGH (ref 65–99)
Glucose-Capillary: 251 mg/dL — ABNORMAL HIGH (ref 65–99)

## 2015-01-01 LAB — I-STAT CG4 LACTIC ACID, ED
LACTIC ACID, VENOUS: 2.92 mmol/L — AB (ref 0.5–2.0)
LACTIC ACID, VENOUS: 1.84 mmol/L (ref 0.5–2.0)

## 2015-01-01 LAB — URINE MICROSCOPIC-ADD ON

## 2015-01-01 LAB — LIPASE, BLOOD: LIPASE: 24 U/L (ref 22–51)

## 2015-01-01 LAB — MRSA PCR SCREENING: MRSA BY PCR: NEGATIVE

## 2015-01-01 LAB — TROPONIN I

## 2015-01-01 MED ORDER — FLUCONAZOLE IN SODIUM CHLORIDE 400-0.9 MG/200ML-% IV SOLN
400.0000 mg | INTRAVENOUS | Status: DC
  Filled 2015-01-01: qty 200

## 2015-01-01 MED ORDER — ONDANSETRON HCL 4 MG/2ML IJ SOLN
4.0000 mg | Freq: Once | INTRAMUSCULAR | Status: AC
  Administered 2015-01-01: 4 mg via INTRAVENOUS
  Filled 2015-01-01: qty 2

## 2015-01-01 MED ORDER — SODIUM CHLORIDE 0.9 % IV SOLN
1000.0000 mL | Freq: Once | INTRAVENOUS | Status: AC
  Administered 2015-01-01: 1000 mL via INTRAVENOUS

## 2015-01-01 MED ORDER — FLUCONAZOLE IN SODIUM CHLORIDE 400-0.9 MG/200ML-% IV SOLN
400.0000 mg | INTRAVENOUS | Status: DC
  Administered 2015-01-01: 400 mg via INTRAVENOUS
  Filled 2015-01-01 (×2): qty 200

## 2015-01-01 MED ORDER — INSULIN REGULAR HUMAN 100 UNIT/ML IJ SOLN
INTRAMUSCULAR | Status: DC
  Administered 2015-01-01: 5.4 [IU]/h via INTRAVENOUS
  Administered 2015-01-01: 7.6 [IU]/h via INTRAVENOUS

## 2015-01-01 MED ORDER — SODIUM CHLORIDE 0.9 % IV BOLUS (SEPSIS)
1000.0000 mL | Freq: Once | INTRAVENOUS | Status: AC
Start: 2015-01-01 — End: 2015-01-01
  Administered 2015-01-01: 1000 mL via INTRAVENOUS

## 2015-01-01 MED ORDER — SODIUM CHLORIDE 0.9 % IV SOLN
1000.0000 mL | INTRAVENOUS | Status: DC
  Administered 2015-01-01: 1000 mL via INTRAVENOUS

## 2015-01-01 MED ORDER — PANTOPRAZOLE SODIUM 40 MG IV SOLR
40.0000 mg | INTRAVENOUS | Status: DC
  Administered 2015-01-02 – 2015-01-03 (×2): 40 mg via INTRAVENOUS
  Filled 2015-01-01 (×3): qty 40

## 2015-01-01 MED ORDER — DEXTROSE-NACL 5-0.45 % IV SOLN: INTRAVENOUS | Status: DC

## 2015-01-01 MED ORDER — POLYETHYLENE GLYCOL 3350 17 G PO PACK
17.0000 g | PACK | Freq: Every day | ORAL | Status: DC | PRN
  Filled 2015-01-01: qty 1

## 2015-01-01 MED ORDER — SODIUM CHLORIDE 0.9 % IV SOLN: INTRAVENOUS | Status: DC

## 2015-01-01 MED ORDER — ENOXAPARIN SODIUM 40 MG/0.4ML ~~LOC~~ SOLN
40.0000 mg | SUBCUTANEOUS | Status: DC
  Administered 2015-01-01 – 2015-01-02 (×2): 40 mg via SUBCUTANEOUS
  Filled 2015-01-01 (×3): qty 0.4

## 2015-01-01 MED ORDER — PANTOPRAZOLE SODIUM 40 MG IV SOLR
40.0000 mg | Freq: Once | INTRAVENOUS | Status: AC
  Administered 2015-01-01: 40 mg via INTRAVENOUS
  Filled 2015-01-01: qty 40

## 2015-01-01 MED ORDER — ONDANSETRON HCL 4 MG/2ML IJ SOLN
4.0000 mg | Freq: Four times a day (QID) | INTRAMUSCULAR | Status: DC | PRN
  Administered 2015-01-02: 4 mg via INTRAVENOUS
  Filled 2015-01-01: qty 2

## 2015-01-01 MED ORDER — DEXTROSE-NACL 5-0.45 % IV SOLN
INTRAVENOUS | Status: DC
  Administered 2015-01-01: 22:00:00 via INTRAVENOUS

## 2015-01-01 NOTE — ED Notes (Signed)
Dr. Dalene Seltzer alerted to fluconazole iv not available at [facility], only po diflucan available, she states "leave the order in and they can give it when he gets over to the hospital."

## 2015-01-01 NOTE — ED Notes (Signed)
Per glucostabilizer pt insulin drip to be initiated at 5.4 units/hr.

## 2015-01-01 NOTE — ED Notes (Signed)
Dr. Dalene Seltzer notified of critical glucose level.

## 2015-01-01 NOTE — Progress Notes (Signed)
Called to receive report on patient. Per nurse at [facility] patient already on the way with [facility]. Report recieved. Awaiting patient arrival.

## 2015-01-01 NOTE — ED Provider Notes (Signed)
CSN: [id number]     Arrival date & time 01/01/15  1335 History   First MD Initiated Contact with Patient 01/01/15 1406     Chief Complaint  Patient presents with  . Vomiting     (Consider location/radiation/quality/duration/timing/severity/associated sxs/prior Treatment) HPI Comments: 2-76yrs ago diagnosed with DM, was on metformin, was taken off of it a couple months ago--A1c had dropped to 6.1 and it was stopped.  Patient is a 34 y.o. male presenting with vomiting.  Emesis Severity:  Severe Duration:  1 day Number of daily episodes:  3 episodes, continuous nausea Progression:  Worsening Chronicity:  New Recent urination:  Increased Relieved by:  Nothing Worsened by:  Nothing tried Ineffective treatments:  None tried Associated symptoms: abdominal pain (abd pain, epigastrum), chills and sore throat   Associated symptoms: no diarrhea   Risk factors: diabetes (off metformin since hgb a1c 6.1.)     Past Medical History  Diagnosis Date  . Diabetes mellitus without complication   . Sleep apnea    Past Surgical History  Procedure Laterality Date  . Hernia repair      umbical hernia repair at 35 or 34 years old.  . Foot surgery Right  at age 87    cyst removal   No family history on file. Social History  Substance Use Topics  . Smoking status: Current Every Day Smoker -- 0.50 packs/day    Types: Cigarettes  . Smokeless tobacco: None  . Alcohol Use: No    Review of Systems  Constitutional: Positive for chills and appetite change. Negative for fever.  HENT: Positive for sore throat and trouble swallowing (due to pain). Negative for congestion.   Eyes: Negative for visual disturbance.  Respiratory: Negative for cough and shortness of breath.   Cardiovascular: Positive for chest pain (burning sensation began days ago).  Gastrointestinal: Positive for nausea, vomiting (3x, was dark), abdominal pain (abd pain, epigastrum) and constipation. Negative for diarrhea and blood in  stool.  Endocrine: Positive for polydipsia and polyuria. Negative for polyphagia.  Genitourinary: Negative for dysuria and difficulty urinating.  Musculoskeletal: Negative for back pain.  Skin: Negative for rash.      Allergies  Review of patient's allergies indicates no known allergies.  Home Medications   Prior to Admission medications   Medication Sig Start Date End Date Taking? Authorizing Provider  amitriptyline (ELAVIL) 25 MG tablet Take 1 tablet (25 mg total) by mouth at bedtime as needed for sleep. 08/09/14   Gust Rung, DO  metFORMIN (GLUCOPHAGE XR) 500 MG 24 hr tablet Take 1 tablet (500 mg total) by mouth daily with breakfast. 08/09/14 08/09/15  Gust Rung, DO  pantoprazole (PROTONIX) 40 MG tablet Take 1 tablet (40 mg total) by mouth daily. 08/09/14 08/09/15  Gust Rung, DO  podofilox (CONDYLOX) 0.5 % gel Apply topically 2 (two) times daily. Apply for 3 consecutive days followed by 4 days without treatment. Repeat as needed for up to 4 weeks. 08/09/14   Gust Rung, DO  zolpidem (AMBIEN CR) 12.5 MG CR tablet Take 1 tablet (12.5 mg total) by mouth at bedtime as needed for sleep. 09/12/14 09/12/15  Stark Bray, MD   BP 141/88 mmHg  Pulse 109  Temp(Src) 98.2 F (36.8 C) (Oral)  Resp 20  Ht 6' (1.829 m)  Wt 279 lb 15.8 oz (127 kg)  BMI 37.96 kg/m2  SpO2 98% Physical Exam  Constitutional: He is oriented to person, place, and time. He appears well-developed and well-nourished.  No distress.  HENT:  Head: Normocephalic and atraumatic.  Mouth/Throat: Oropharyngeal exudate (white plaques pharynx, palate) present.  Eyes: Conjunctivae and EOM are normal.  Neck: Normal range of motion.  Cardiovascular: Regular rhythm, normal heart sounds and intact distal pulses.  Tachycardia present.  Exam reveals no gallop and no friction rub.   No murmur heard. Pulmonary/Chest: Effort normal and breath sounds normal. No respiratory distress. He has no wheezes. He has no rales.   Abdominal: Soft. He exhibits no distension. There is no tenderness. There is no guarding.  Musculoskeletal: He exhibits no edema.  Neurological: He is alert and oriented to person, place, and time.  Skin: Skin is warm and dry. No rash noted. He is not diaphoretic.  Nursing note and vitals reviewed.   ED Course  Procedures (including critical care time) Labs Review Labs Reviewed  COMPREHENSIVE METABOLIC PANEL - Abnormal; Notable for the following:    Sodium 123 (*)    Potassium 5.7 (*)    Chloride 86 (*)    CO2 11 (*)    Glucose, Bld 802 (*)    BUN 29 (*)    Creatinine, Ser 1.94 (*)    Total Protein 8.4 (*)    Total Bilirubin 1.6 (*)    GFR calc non Af Amer 43 (*)    GFR calc Af Amer 50 (*)    Anion gap 26 (*)    All other components within normal limits  CBC WITH DIFFERENTIAL/PLATELET - Abnormal; Notable for the following:    RBC 6.40 (*)    Hemoglobin 17.6 (*)    HCT 53.6 (*)    Neutrophils Relative % 83 (*)    Neutro Abs 8.4 (*)    All other components within normal limits  URINALYSIS, ROUTINE W REFLEX MICROSCOPIC (NOT AT [facility]) - Abnormal; Notable for the following:    Glucose, UA >1000 (*)    Hgb urine dipstick TRACE (*)    Ketones, ur >80 (*)    All other components within normal limits  BASIC METABOLIC PANEL - Abnormal; Notable for the following:    Sodium 129 (*)    Chloride 98 (*)    CO2 12 (*)    Glucose, Bld 457 (*)    BUN 26 (*)    Creatinine, Ser 1.49 (*)    Calcium 8.4 (*)    GFR calc non Af Amer 59 (*)    Anion gap 19 (*)    All other components within normal limits  GLUCOSE, CAPILLARY - Abnormal; Notable for the following:    Glucose-Capillary 257 (*)    All other components within normal limits  I-STAT VENOUS BLOOD GAS, ED - Abnormal; Notable for the following:    pH, Ven 7.195 (*)    pCO2, Ven 25.5 (*)    pO2, Ven 83.0 (*)    Bicarbonate 9.9 (*)    Acid-base deficit 16.0 (*)    All other components within normal limits  I-STAT CG4 LACTIC  ACID, ED - Abnormal; Notable for the following:    Lactic Acid, Venous 2.92 (*)    All other components within normal limits  CBG MONITORING, ED - Abnormal; Notable for the following:    Glucose-Capillary >600 (*)    All other components within normal limits  CBG MONITORING, ED - Abnormal; Notable for the following:    Glucose-Capillary 448 (*)    All other components within normal limits  CBG MONITORING, ED - Abnormal; Notable for the following:    Glucose-Capillary 380 (*)  All other components within normal limits  URINE CULTURE  MRSA PCR SCREENING  LIPASE, BLOOD  URINE MICROSCOPIC-ADD ON  TROPONIN I  HIV ANTIBODY (ROUTINE TESTING)  BASIC METABOLIC PANEL  BASIC METABOLIC PANEL  HEMOGLOBIN A1C  I-STAT CG4 LACTIC ACID, ED    Imaging Review No results found. I have personally reviewed and evaluated these images and lab results as part of my medical decision-making.   EKG Interpretation   Date/Time:  Tuesday January 01 2015 16:02:28 EDT Ventricular Rate:  111 PR Interval:  156 QRS Duration: 76 QT Interval:  336 QTC Calculation: 456 R Axis:   73 Text Interpretation:  Sinus tachycardia Otherwise normal ECG Nonspecific T  wave abnormality No significant change since last tracing Confirmed by  [facility] MD, Abraham Entwistle ([id number]) on 01/01/2015 6:02:00 PM      MDM   Final diagnoses:  Diabetic ketoacidosis without coma associated with type 2 diabetes mellitus  Tachycardia  Candidiasis  Lactic acidosis  Metabolic acidosis   34 year old male with a history of diabetes, hyperlipidemia, with diagnosis of diabetes type 2 2 years ago and discontinuation of his metformin and proximally 4 months ago secondary to improved A1c's presents with concern of 3 days of decreased appetite, odynophagia, with nausea and vomiting beginning today.    Patient's glucose is 802, bicarbonate is 11, anion gap of 26, pH of 7.1 with a PCO2 of 25, concerning for DKA. Potassium was 5.7, and patient was  started on an insulin drip, with serial point-of-care test glucose and BMPs ordered.    Patient reported odynophagia and was noted to have white plaques to his oropharynx is in its palate concerning for candidiasis, and given his significant pain with swallowing and burning chest pain, there is concern for esophageal candidiasis. Ordered IV fluconazole however that does is not available in this facility however discussed with patient team. No hx of HIV and reports prior negative testing, however ordered hiv ab.  He is given Protonix for symptom relief for I burning abdominal pain. He reported possible coffee-ground emesis, however his hemoglobin is normal to high/hemoconcentrated and have low suspicion for significant upper GI bleed.  EKG showed sinus tachycardia with nonspecific findings, and troponin was negative.  Pt reports improving symptoms and labs show slowly improving DKA on recheck. Pt awaiting transfer to Stepdown bed at [facility] with Internal Medicine team.  CRITICAL CARE: DKA management Performed by: Rhae Lerner   Total critical care time:  Critical care time was exclusive of separately billable procedures and treating other patients.  Critical care was necessary to treat or prevent imminent or life-threatening deterioration.  Critical care was time spent personally by me on the following activities: development of treatment plan with patient and/or surrogate as well as nursing, discussions with consultants, evaluation of patient's response to treatment, examination of patient, obtaining history from patient or surrogate, ordering and performing treatments and interventions, ordering and review of laboratory studies, ordering and review of radiographic studies, pulse oximetry and re-evaluation of patient's condition.    Alvira Monday, MD 01/01/15 2050

## 2015-01-01 NOTE — Progress Notes (Signed)
Pt arrived to [facility] [place] patient A&0x4, HR ST other VS stable. No skin breakdown noted. Upon arrival insulin gtt at 9.52ml/hr with NS at 125. Will continue to monitor.

## 2015-01-01 NOTE — H&P (Signed)
Date: 01/01/2015               Patient Name:  Brady Gibbs MRN: [id number]  DOB: December 25, 1980 Age / Sex: 34 y.o., male   PCP: Gust Rung, DO         Medical Service: Internal Medicine Teaching Service         Attending Physician: Dr. Levert Feinstein, MD    First Contact: Army Melia, MS4 Pager: [phone]  Second Contact: Dr. Johna Roles Pager: [phone]       After Hours (After 5p/  First Contact Pager: [phone]  weekends / holidays): Second Contact Pager: [phone]   Chief Complaint: nausea, decreased appetite, polyuria, polydipsia  History of Present Illness: Brady Gibbs is a 34 yo male with DMII and OSA, presenting with 3-4 day history of decreased appetite, nausea, polyuria, and polydipsia.  He last checked his CBG 2-3 days ago, at which time it was 130.  He is usually in the 140s, and rarely greater than 200.   His diabetes has been managed with diet, with intermittent use of his Metformin, reportedly per his PCP. Patient reports not having eaten a full meal for 3-4 days, but has been drinking [employer] sports drinks to stay hydrated.  He has also been drinking lots of fluids, but endorses frequent urination.  He endorses fatigue, being too tired to walk home 1.5 miles, with shortness of breath.  He denies abdominal pain.  He denies recent illness, though he endorses occasional subjective fevers and chills.  He also endorses 1 episode of coffee ground emesis today.  This has never happened before. He has no h/o ulcers.  He denies abdominal pain associated with food.  He denies excessive alcohol intake (occasional 1 beer) and denies NSAID use.  The patient also has a h/o heartburn and epigastric pain.  He has taken a PPI in the past, but has not taken it for years because his symptoms had relieved.  However, he currently endorses frequent belching and susbsternal burning for months.  He denies odynophagia, but endorses frequent sensation of food getting stuck in his throat.  He occasionally will  cough chewed food back up.  He has never had an EGD.    In the ED, his initial blood glucose was 802, with AG of 26, and bicarb 11.  His blood gas was notable for pH 7.2, CO2 25.5, O2 83, and sat 94% (listed as vBG).  There were glucose and ketones in the urine.  Meds: Current Facility-Administered Medications  Medication Dose Route Frequency Provider Last Rate Last Dose  . 0.9 %  sodium chloride infusion  1,000 mL Intravenous Continuous Alvira Monday, MD 125 mL/hr at 01/01/15 1721 1,000 mL at 01/01/15 1721  . dextrose 5 %-0.45 % sodium chloride infusion   Intravenous Continuous Alvira Monday, MD      . fluconazole (DIFLUCAN) IVPB 400 mg  400 mg Intravenous Q24H Levert Feinstein, MD      . insulin regular (NOVOLIN R,HUMULIN R) 250 Units in sodium chloride 0.9 % 250 mL (1 Units/mL) infusion   Intravenous Continuous Alvira Monday, MD 9.6 mL/hr at 01/01/15 1812 9.6 Units/hr at 01/01/15 1812    Allergies: Allergies as of 01/01/2015  . (No Known Allergies)   Past Medical History  Diagnosis Date  . Diabetes mellitus without complication   . Sleep apnea    Past Surgical History  Procedure Laterality Date  . Hernia repair      umbical hernia repair at 1  or 34 years old.  . Foot surgery Right  at age 45    cyst removal   No family history on file. Social History   Social History  . Marital Status: Married    Spouse Name: N/A  . Number of Children: N/A  . Years of Education: 10   Occupational History  . Not on file.   Social History Main Topics  . Smoking status: Current Every Day Smoker -- 0.50 packs/day    Types: Cigarettes  . Smokeless tobacco: Not on file  . Alcohol Use: No  . Drug Use: No  . Sexual Activity: Not on file   Other Topics Concern  . Not on file   Social History Narrative    Review of Systems: Pertinent items are noted in HPI.  Physical Exam: Blood pressure 141/88, pulse 109, temperature 99.1 F (37.3 C), temperature source Oral, resp. rate  20, height 6' (1.829 m), weight 280 lb (127.007 kg), SpO2 98 %. Physical Exam  Constitutional: He is oriented to person, place, and time. He appears well-developed and well-nourished. No distress.  HENT:  Head: Normocephalic and atraumatic.  White plaques present on tongue and oropharynx, able to be scraped with tongue depressor   Eyes: EOM are normal.  Neck: Normal range of motion. No tracheal deviation present.  Cardiovascular: Regular rhythm, normal heart sounds and intact distal pulses.   No murmur heard. Tachycardic  Pulmonary/Chest: Effort normal and breath sounds normal. No respiratory distress. He has no wheezes. He has no rales.  Abdominal: Soft. He exhibits no distension. There is no tenderness. There is no rebound and no guarding.  Musculoskeletal: Normal range of motion. He exhibits no edema.  Neurological: He is alert and oriented to person, place, and time.  Skin: Skin is warm and dry. He is not diaphoretic.     Lab results: Basic Metabolic Panel:  Recent Labs  19/14/78 1405 01/01/15 1710  NA 123* 129*  K 5.7* 4.6  CL 86* 98*  CO2 11* 12*  GLUCOSE 802* 457*  BUN 29* 26*  CREATININE 1.94* 1.49*  CALCIUM 10.1 8.4*   Liver Function Tests:  Recent Labs  01/01/15 1405  AST 16  ALT 29  ALKPHOS 75  BILITOT 1.6*  PROT 8.4*  ALBUMIN 4.7    Recent Labs  01/01/15 1405  LIPASE 24   No results for input(s): AMMONIA in the last 72 hours. CBC:  Recent Labs  01/01/15 1405  WBC 10.0  NEUTROABS 8.4*  HGB 17.6*  HCT 53.6*  MCV 83.8  PLT 276   Cardiac Enzymes:  Recent Labs  01/01/15 1440  TROPONINI <0.03   BNP: No results for input(s): PROBNP in the last 72 hours. D-Dimer: No results for input(s): DDIMER in the last 72 hours. CBG:  Recent Labs  01/01/15 1603 01/01/15 1705 01/01/15 1808  GLUCAP >600* 448* 380*   Hemoglobin A1C: No results for input(s): HGBA1C in the last 72 hours. Fasting Lipid Panel: No results for input(s): CHOL,  HDL, LDLCALC, TRIG, CHOLHDL, LDLDIRECT in the last 72 hours. Thyroid Function Tests: No results for input(s): TSH, T4TOTAL, FREET4, T3FREE, THYROIDAB in the last 72 hours. Anemia Panel: No results for input(s): VITAMINB12, FOLATE, FERRITIN, TIBC, IRON, RETICCTPCT in the last 72 hours. Coagulation: No results for input(s): LABPROT, INR in the last 72 hours. Urine Drug Screen: Drugs of Abuse  No results found for: LABOPIA, COCAINSCRNUR, LABBENZ, AMPHETMU, THCU, LABBARB  Alcohol Level: No results for input(s): ETH in the last 72 hours.  Urinalysis:  Recent Labs  01/01/15 1445  COLORURINE YELLOW  LABSPEC 1.030  PHURINE 5.0  GLUCOSEU >1000*  HGBUR TRACE*  BILIRUBINUR NEGATIVE  KETONESUR >80*  PROTEINUR NEGATIVE  UROBILINOGEN 0.2  NITRITE NEGATIVE  LEUKOCYTESUR NEGATIVE   Imaging results:  No results found.  Other results: EKG: sinus tachycardia.  Assessment & Plan by Problem: Active Problems:   DKA (diabetic ketoacidoses)  Mr. Deterding is a 34 yo male with DMII, OSA, and GERD, presenting with 3-4 day history of decreased appetite, nausea, polyuria, and polydipsia, concerning for DKA.  DKA: Patient's initial glucose of 802 and AG fo 26, correcting after initiation of insulin drip.  Other than oral thrush, patient does not have any obvious triggers for current episode.  His report of coffee ground emesis is concerning for PUD as another possible etiology.  Patient reports good glycemic control, with last A1c 6.7 in April 2016.  Will repeat A1c.  Now s/p 3 L NS. - Insulin gtt - NS @ 125 cc/hr - Zofran PRN - NPO  GERD: Patient has h/o GERD, but does not take PPI.  Now complaining of substernal burning, dysphagia, occasional regurgitation of chewed food, and one episode of reported coffee ground emesis.  Symptoms concerning for PUD, Barrett's esophagus, and/or stricture.  Patient has never had EGD.  Symptoms may be relieved with treatment of candidiasis, but EDG is recommended.  Hgb  normal at this time. - Recommend EGD - Protonix   Oral/Esophageal Candidiasis: No h/o HIV or other immunosuppression.  Patient reports good glycemic control at home.  Repeat HIV in process.   - HIV - A1c - Fluconazole IV  AKI: Likely pre-renal in setting of DKA. Will see if Cr decreases with IVF. - CTM  OSA: CPAP nightly  FEN/GI - NPO - Protonix  DVT Ppx: Lovenox  Dispo: Disposition is deferred at this time, awaiting improvement of current medical problems.   The patient does have a current PCP Gust Rung, DO) and does need an [facility] hospital follow-up appointment after discharge.  The patient does not have transportation limitations that hinder transportation to clinic appointments.  Signed: Jana Half, MD, PhD 01/01/2015, 8:21 PM

## 2015-01-01 NOTE — ED Notes (Signed)
C/o vomiting, fatigue, chills x 4 days

## 2015-01-02 DIAGNOSIS — Z794 Long term (current) use of insulin: Secondary | ICD-10-CM

## 2015-01-02 DIAGNOSIS — Z79899 Other long term (current) drug therapy: Secondary | ICD-10-CM

## 2015-01-02 DIAGNOSIS — B370 Candidal stomatitis: Secondary | ICD-10-CM

## 2015-01-02 LAB — GLUCOSE, CAPILLARY
GLUCOSE-CAPILLARY: 136 mg/dL — AB (ref 65–99)
GLUCOSE-CAPILLARY: 186 mg/dL — AB (ref 65–99)
GLUCOSE-CAPILLARY: 171 mg/dL — AB (ref 65–99)
GLUCOSE-CAPILLARY: 173 mg/dL — AB (ref 65–99)
GLUCOSE-CAPILLARY: 367 mg/dL — AB (ref 65–99)
Glucose-Capillary: 110 mg/dL — ABNORMAL HIGH (ref 65–99)
Glucose-Capillary: 143 mg/dL — ABNORMAL HIGH (ref 65–99)
Glucose-Capillary: 161 mg/dL — ABNORMAL HIGH (ref 65–99)
Glucose-Capillary: 163 mg/dL — ABNORMAL HIGH (ref 65–99)
Glucose-Capillary: 169 mg/dL — ABNORMAL HIGH (ref 65–99)
Glucose-Capillary: 174 mg/dL — ABNORMAL HIGH (ref 65–99)
Glucose-Capillary: 180 mg/dL — ABNORMAL HIGH (ref 65–99)
Glucose-Capillary: 193 mg/dL — ABNORMAL HIGH (ref 65–99)
Glucose-Capillary: 275 mg/dL — ABNORMAL HIGH (ref 65–99)
Glucose-Capillary: 348 mg/dL — ABNORMAL HIGH (ref 65–99)

## 2015-01-02 LAB — URINE CULTURE: CULTURE: NO GROWTH

## 2015-01-02 LAB — BASIC METABOLIC PANEL
Anion gap: 12 (ref 5–15)
Anion gap: 10 (ref 5–15)
BUN: 19 mg/dL (ref 6–20)
BUN: 16 mg/dL (ref 6–20)
CALCIUM: 8.5 mg/dL — AB (ref 8.9–10.3)
CALCIUM: 8.3 mg/dL — AB (ref 8.9–10.3)
CO2: 19 mmol/L — ABNORMAL LOW (ref 22–32)
CO2: 19 mmol/L — ABNORMAL LOW (ref 22–32)
Chloride: 103 mmol/L (ref 101–111)
Chloride: 102 mmol/L (ref 101–111)
Creatinine, Ser: 1.36 mg/dL — ABNORMAL HIGH (ref 0.61–1.24)
Creatinine, Ser: 1.35 mg/dL — ABNORMAL HIGH (ref 0.61–1.24)
GFR calc Af Amer: >60 mL/min (ref >60)
GFR calc Af Amer: >60 mL/min (ref >60)
GLUCOSE: 106 mg/dL — AB (ref 65–99)
GLUCOSE: 178 mg/dL — AB (ref 65–99)
Potassium: 3.7 mmol/L (ref 3.5–5.1)
Potassium: 3.9 mmol/L (ref 3.5–5.1)
Sodium: 134 mmol/L — ABNORMAL LOW (ref 135–145)
Sodium: 131 mmol/L — ABNORMAL LOW (ref 135–145)

## 2015-01-02 LAB — HEMOGLOBIN A1C
HEMOGLOBIN A1C: 14.4 % — AB (ref 4.8–5.6)
MEAN PLASMA GLUCOSE: 367 mg/dL

## 2015-01-02 LAB — HIV ANTIBODY (ROUTINE TESTING W REFLEX): HIV Screen 4th Generation wRfx: NONREACTIVE

## 2015-01-02 MED ORDER — INSULIN ASPART 100 UNIT/ML ~~LOC~~ SOLN
6.0000 [IU] | Freq: Three times a day (TID) | SUBCUTANEOUS | Status: DC
  Administered 2015-01-03 (×2): 6 [IU] via SUBCUTANEOUS

## 2015-01-02 MED ORDER — INSULIN ASPART 100 UNIT/ML ~~LOC~~ SOLN
4.0000 [IU] | Freq: Three times a day (TID) | SUBCUTANEOUS | Status: DC
  Administered 2015-01-02 (×2): 4 [IU] via SUBCUTANEOUS

## 2015-01-02 MED ORDER — FLUCONAZOLE 200 MG PO TABS
200.0000 mg | ORAL_TABLET | Freq: Every day | ORAL | Status: DC
  Administered 2015-01-02 – 2015-01-03 (×2): 200 mg via ORAL
  Filled 2015-01-02 (×2): qty 1

## 2015-01-02 MED ORDER — INSULIN ASPART 100 UNIT/ML ~~LOC~~ SOLN
0.0000 [IU] | Freq: Three times a day (TID) | SUBCUTANEOUS | Status: DC
  Administered 2015-01-02: 3 [IU] via SUBCUTANEOUS
  Administered 2015-01-02: 15 [IU] via SUBCUTANEOUS

## 2015-01-02 MED ORDER — INSULIN GLARGINE 100 UNIT/ML ~~LOC~~ SOLN
25.0000 [IU] | Freq: Every day | SUBCUTANEOUS | Status: DC
  Administered 2015-01-02: 25 [IU] via SUBCUTANEOUS
  Filled 2015-01-02 (×2): qty 0.25

## 2015-01-02 MED ORDER — INFLUENZA VAC SPLIT QUAD 0.5 ML IM SUSY
0.5000 mL | PREFILLED_SYRINGE | INTRAMUSCULAR | Status: AC
  Administered 2015-01-03: 0.5 mL via INTRAMUSCULAR
  Filled 2015-01-02: qty 0.5

## 2015-01-02 MED ORDER — INSULIN ASPART 100 UNIT/ML ~~LOC~~ SOLN
0.0000 [IU] | Freq: Every day | SUBCUTANEOUS | Status: DC
  Administered 2015-01-02: 4 [IU] via SUBCUTANEOUS

## 2015-01-02 MED ORDER — POTASSIUM CHLORIDE 10 MEQ/100ML IV SOLN
10.0000 meq | INTRAVENOUS | Status: AC
  Administered 2015-01-02 (×3): 10 meq via INTRAVENOUS
  Filled 2015-01-02 (×3): qty 100

## 2015-01-02 NOTE — Progress Notes (Signed)
Subjective: Patient reports feeling well this morning. Improved appetite and nausea has resolved.  Objective: Vital signs in last 24 hours: Filed Vitals:   01/02/15 0305 01/02/15 0306 01/02/15 0741 01/02/15 1119  BP: 126/63  130/73   Pulse: 95  84   Temp:  98 F (36.7 C) 97.5 F (36.4 C) 98 F (36.7 C)  TempSrc:   Oral Oral  Resp: 14  15   Height:      Weight:      SpO2: 98%  97%    Weight change:   Intake/Output Summary (Last 24 hours) at 01/02/15 1347 Last data filed at 01/02/15 0800  Gross per 24 hour  Intake 6824.72 ml  Output   1775 ml  Net 5049.72 ml   Physical Exam  Constitutional: He is oriented to person, place, and time. He appears well-developed and well-nourished. No distress.  HENT:  Head: Normocephalic and atraumatic.  White plaques present on tongue and oropharynx Eyes: EOM are normal.  Neck: Normal range of motion. No tracheal deviation present.  Cardiovascular: RRR, normal heart sounds and intact distal pulses.  No murmur heard.  Pulmonary/Chest: Effort normal and breath sounds normal. No respiratory distress. He has no wheezes. He has no rales.  Abdominal: Soft. He exhibits no distension. There is no tenderness. There is no rebound and no guarding.  Musculoskeletal: Normal range of motion. He exhibits no edema.  Neurological: He is alert and oriented to person, place, and time.  Skin: Skin is warm and dry. He is not diaphoretic.   Lab Results: Basic Metabolic Panel:  Recent Labs Lab 01/02/15 0149 01/02/15 0645  NA 134* 131*  K 3.7 3.9  CL 103 102  CO2 19* 19*  GLUCOSE 106* 178*  BUN 19 16  CREATININE 1.36* 1.35*  CALCIUM 8.5* 8.3*   Liver Function Tests:  Recent Labs Lab 01/01/15 1405  AST 16  ALT 29  ALKPHOS 75  BILITOT 1.6*  PROT 8.4*  ALBUMIN 4.7    Recent Labs Lab 01/01/15 1405  LIPASE 24   CBC:  Recent Labs Lab 01/01/15 1405  WBC 10.0  NEUTROABS 8.4*  HGB 17.6*  HCT 53.6*  MCV 83.8  PLT 276    Cardiac Enzymes:  Recent Labs Lab 01/01/15 1440  TROPONINI <0.03   CBG:  Recent Labs Lab 01/02/15 0645 01/02/15 0735 01/02/15 0854 01/02/15 1011 01/02/15 1117 01/02/15 1202  GLUCAP 174* 163* 186* 171* 173* 161*   Hemoglobin A1C:  Recent Labs Lab 01/01/15 2050  HGBA1C 14.4*   Urinalysis:  Recent Labs Lab 01/01/15 1445  COLORURINE YELLOW  LABSPEC 1.030  PHURINE 5.0  GLUCOSEU >1000*  HGBUR TRACE*  BILIRUBINUR NEGATIVE  KETONESUR >80*  PROTEINUR NEGATIVE  UROBILINOGEN 0.2  NITRITE NEGATIVE  LEUKOCYTESUR NEGATIVE   Micro Results: Recent Results (from the past 240 hour(s))  MRSA PCR Screening     Status: None   Collection Time: 01/01/15  7:29 PM  Result Value Ref Range Status   MRSA by PCR NEGATIVE NEGATIVE Final    Comment:        The GeneXpert MRSA Assay (FDA approved for NASAL specimens only), is one component of a comprehensive MRSA colonization surveillance program. It is not intended to diagnose MRSA infection nor to guide or monitor treatment for MRSA infections.    Studies/Results: No results found. Medications: I have reviewed the patient's current medications. Scheduled Meds: . enoxaparin (LOVENOX) injection  40 mg Subcutaneous Q24H  . fluconazole  200 mg Oral Daily  . [  START ON 01/03/2015] Influenza vac split quadrivalent PF  0.5 mL Intramuscular Tomorrow-1000  . insulin aspart  0-15 Units Subcutaneous TID WC  . insulin aspart  0-5 Units Subcutaneous QHS  . insulin aspart  4 Units Subcutaneous TID WC  . insulin glargine  25 Units Subcutaneous Daily  . pantoprazole (PROTONIX) IV  40 mg Intravenous Q24H   Continuous Infusions:  PRN Meds:.ondansetron (ZOFRAN) IV Assessment/Plan: Active Problems:   DKA (diabetic ketoacidoses)   Candidiasis   Diabetic ketoacidosis without coma associated with type 2 diabetes mellitus   Metabolic acidosis  DKA: AG closed following insulin drip and IVFs. Most recent CBG was 161. A1c 14.4. Reports 4  day history of unexplained nausea and decreased appetite. Started drinking large volumes of sports drinks with high sugar content which may have contributed to his DKA. UA with ketones.  - Stop insulin ggt - Lantus 25 units daily - Novolog 4 units qac - SSI-m - Stop IVFs - Resume carb modified diet - Transfer to floor - BMET in AM  GERD: Patient has h/o GERD, but does not take PPI. No longer complaining of any dysphagia or emesis. Emesis likely 2/2 DKA. - FOBT pending - Continue Protonix  - Zofran prn   Oral/Esophageal Candidiasis: No h/o HIV or other immunosuppression. Patient reports good glycemic control at home. Repeat HIV negative.Unclear etiology of thrush.  - A1c 14.4 - Stop Fluconazole IV, transition to oral  AKI: Cr 1.94 on admission. Improved to 1.35 today following IVF. Baseline appears to be between 1.1-1.2 per chart review.  - Stop IVF - Resume normal diet - BMET in AM  OSA: CPAP nightly  FEN/GI - Carb modified diet - Protonix  DVT Ppx: Lovenox  Dispo: Disposition is deferred at this time, awaiting improvement of current medical problems.  Anticipated discharge in approximately 1-2 day(s).   The patient does have a current PCP Gust Rung, DO) and does need an [facility] hospital follow-up appointment after discharge.  The patient does not have transportation limitations that hinder transportation to clinic appointments.  .Services Needed at time of discharge: Y = Yes, Blank = No PT:   OT:   RN:   Equipment:   Other:     LOS: 1 day   Valentino Nose, MD 01/02/2015, 1:47 PM

## 2015-01-02 NOTE — Progress Notes (Signed)
Utilization review completed. Damisha Wolff, RN, BSN. 

## 2015-01-02 NOTE — Progress Notes (Signed)
Patient has CPAP in the room and states he can place on himself when ready. RT will continue to monitor.

## 2015-01-02 NOTE — Progress Notes (Signed)
Inpatient Diabetes Program Recommendations  AACE/ADA: New Consensus Statement on Inpatient Glycemic Control (2013)  Target Ranges:  Prepandial:   less than 140 mg/dL      Peak postprandial:   less than 180 mg/dL (1-2 hours)      Critically ill patients:  140 - 180 mg/dL   Reason for Visit: DKA - Consult  Diabetes history: DM2 Outpatient Diabetes medications: metformin 500 mg QD Current orders for Inpatient glycemic control: Novolog moderate tidwc and hs + 4 units tidwc, Lantus 25 units QD  Results for Brady Gibbs, Brady Gibbs (MRN [id number]) as of 01/02/2015 15:04  Ref. Range 01/02/2015 05:03 01/02/2015 05:54 01/02/2015 06:45 01/02/2015 07:35 01/02/2015 08:54 01/02/2015 10:11 01/02/2015 11:17 01/02/2015 12:02  Glucose-Capillary Latest Ref Range: 65-99 mg/dL 180 (H) 193 (H) 174 (H) 163 (H) 186 (H) 171 (H) 173 (H) 161 (H)  Results for Brady Gibbs, Brady Gibbs (MRN [id number]) as of 01/02/2015 15:04  Ref. Range 08/09/2014 15:52 01/01/2015 20:50  Hemoglobin A1C Latest Ref Range: 4.8-5.6 % 6.7 14.4 (H)   Uncontrolled blood sugars with home regimen of metformin. Will need to go home on insulin with HgbA1C of 14.4%  Agree with Lantus 25 units QD, Novolog moderate tidwc and hs + 4 units tidwc. Will likely need increase in meal coverage insulin.  Will order insulin starter kit. RN to begin teaching insulin administration. Recommend scheduling OV with CDE.  Will continue to follow. Thank you. Lorenda Peck, RD, LDN, CDE Inpatient Diabetes Coordinator [phone]

## 2015-01-02 NOTE — Progress Notes (Signed)
Nutrition Brief Note  Patient identified on the Malnutrition Screening Tool (MST) Report.  Wt Readings from Last 15 Encounters:  01/01/15 279 lb 15.8 oz (127 kg)  09/12/14 306 lb 8 oz (139.027 kg)  08/09/14 307 lb (139.254 kg)  08/17/13 295 lb 11.2 oz (134.129 kg)  12/08/12 313 lb 9.6 oz (142.248 kg)  10/31/12 315 lb 12.8 oz (143.246 kg)  09/27/12 315 lb 3.2 oz (142.974 kg)  09/13/12 306 lb (138.801 kg)  07/29/12 313 lb 8 oz (142.203 kg)  07/18/12 314 lb (142.429 kg)  06/14/12 314 lb 8 oz (142.656 kg)  05/23/12 312 lb 6.4 oz (141.704 kg)  05/19/12 312 lb 1.6 oz (141.568 kg)  04/14/12 307 lb 4.8 oz (139.39 kg)    Body mass index is 37.96 kg/(m^2). Patient meets criteria for class 2 obesity based on current BMI.   Patient with some weight loss, not significant for the time frame. Current diet order is CHO modified, just advanced. Labs and medications reviewed. Nutrition focused physical exam completed.  No muscle or subcutaneous fat depletion noticed.  No nutrition interventions warranted at this time. If nutrition issues arise, please consult RD.   Joaquin Courts, RD, LDN, CNSC Pager [phone] After Hours Pager [phone]

## 2015-01-03 ENCOUNTER — Telehealth: Payer: Self-pay | Admitting: Dietician

## 2015-01-03 DIAGNOSIS — E872 Acidosis: Secondary | ICD-10-CM

## 2015-01-03 LAB — BASIC METABOLIC PANEL
Anion gap: 12 (ref 5–15)
BUN: 14 mg/dL (ref 6–20)
CALCIUM: 8.2 mg/dL — AB (ref 8.9–10.3)
CHLORIDE: 98 mmol/L — AB (ref 101–111)
CO2: 19 mmol/L — ABNORMAL LOW (ref 22–32)
CREATININE: 1.23 mg/dL (ref 0.61–1.24)
GFR calc non Af Amer: >60 mL/min (ref >60)
Glucose, Bld: 275 mg/dL — ABNORMAL HIGH (ref 65–99)
Potassium: 3.6 mmol/L (ref 3.5–5.1)
SODIUM: 129 mmol/L — AB (ref 135–145)

## 2015-01-03 LAB — GLUCOSE, CAPILLARY
GLUCOSE-CAPILLARY: 331 mg/dL — AB (ref 65–99)
GLUCOSE-CAPILLARY: 324 mg/dL — AB (ref 65–99)

## 2015-01-03 MED ORDER — ATORVASTATIN CALCIUM 20 MG PO TABS
20.0000 mg | ORAL_TABLET | Freq: Every day | ORAL | Status: DC
  Filled 2015-01-03: qty 1

## 2015-01-03 MED ORDER — INSULIN STARTER KIT- SYRINGES (ENGLISH)
1.0000 | Freq: Once | Status: DC
  Filled 2015-01-03 (×3): qty 1

## 2015-01-03 MED ORDER — INSULIN ASPART 100 UNIT/ML ~~LOC~~ SOLN: 6.0000 [IU] | Freq: Three times a day (TID) | SUBCUTANEOUS | Status: DC

## 2015-01-03 MED ORDER — INSULIN GLARGINE 100 UNIT/ML ~~LOC~~ SOLN
30.0000 [IU] | Freq: Every day | SUBCUTANEOUS | Status: DC
  Administered 2015-01-03: 30 [IU] via SUBCUTANEOUS
  Filled 2015-01-03: qty 0.3

## 2015-01-03 MED ORDER — METFORMIN HCL ER 500 MG PO TB24: 500.0000 mg | ORAL_TABLET | Freq: Every day | ORAL | Status: DC

## 2015-01-03 MED ORDER — INSULIN ASPART 100 UNIT/ML ~~LOC~~ SOLN: 0.0000 [IU] | Freq: Every day | SUBCUTANEOUS | Status: DC

## 2015-01-03 MED ORDER — INSULIN GLARGINE 100 UNIT/ML ~~LOC~~ SOLN: 30.0000 [IU] | Freq: Every day | SUBCUTANEOUS | Status: DC

## 2015-01-03 MED ORDER — INSULIN NPH ISOPHANE & REGULAR (70-30) 100 UNIT/ML ~~LOC~~ SUSP: SUBCUTANEOUS | Status: DC

## 2015-01-03 MED ORDER — INSULIN ASPART 100 UNIT/ML ~~LOC~~ SOLN
0.0000 [IU] | Freq: Three times a day (TID) | SUBCUTANEOUS | Status: DC
Start: 2015-01-03 — End: 2015-01-03
  Administered 2015-01-03 (×2): 15 [IU] via SUBCUTANEOUS

## 2015-01-03 MED ORDER — FLUCONAZOLE 200 MG PO TABS: 200.0000 mg | ORAL_TABLET | Freq: Every day | ORAL | Status: DC

## 2015-01-03 MED ORDER — ATORVASTATIN CALCIUM 20 MG PO TABS: 20.0000 mg | ORAL_TABLET | Freq: Every day | ORAL | Status: DC

## 2015-01-03 NOTE — Telephone Encounter (Signed)
CDE called [employer] for benefits for a preferred meter and insulin(Lantus and  Novolog) Per the representative for [employer], patient's coverage ended on 11/25/14.   Spoke with patient in his room. He confirmed that he is presently uninsured. He plans to apply for the orange card until he gets insurance. He agrees that [facility] ReliOn insulin ( choices at [facility] are NPH, Regular or 70/30 insulin) and meter are most realistic right now. He has injected insulin today and feels comfortable with checkng his blood sugars. CDE will provide him with samples of syringes, Numbers to call for questions or emergencies and pharmacy discount cards.

## 2015-01-03 NOTE — Progress Notes (Signed)
Patient placed on CPAP for the night and tolerating well at this time. Was instructed to call respiratory if he needed any further assistance.

## 2015-01-03 NOTE — Progress Notes (Signed)
Subjective: Patient with no complaints this morning. Nausea improved. Tolerating diet well.    Objective: Vital signs in last 24 hours: Filed Vitals:   01/02/15 1600 01/02/15 2007 01/02/15 2305 01/03/15 0425  BP:  150/74 130/77 125/76  Pulse:  88 92 81  Temp: 97.9 F (36.6 C) 98 F (36.7 C) 98.5 F (36.9 C) 97.8 F (36.6 C)  TempSrc: Oral Oral Oral Oral  Resp:  14 12 12   Height:      Weight:      SpO2:  98% 100% 99%   Weight change:   Intake/Output Summary (Last 24 hours) at 01/03/15 0656 Last data filed at 01/03/15 0300  Gross per 24 hour  Intake 1127.29 ml  Output   2300 ml  Net -1172.71 ml   Physical Exam  Constitutional: He is oriented to person, place, and time. He appears well-developed and well-nourished. No distress.  HENT:  Head: Normocephalic and atraumatic.  oropharynx clear Eyes: EOM are normal.  Neck: Normal range of motion. No tracheal deviation present.  Cardiovascular: RRR, normal heart sounds and intact distal pulses.  No murmur heard.  Pulmonary/Chest: Effort normal and breath sounds normal. No respiratory distress. He has no wheezes. He has no rales.  Abdominal: Soft. He exhibits no distension. There is no tenderness. There is no rebound and no guarding.  Musculoskeletal: Normal range of motion. He exhibits no edema.  Neurological: He is alert and oriented to person, place, and time.  Skin: Skin is warm and dry. He is not diaphoretic.   Lab Results: Basic Metabolic Panel:  Recent Labs Lab 01/02/15 0645 01/03/15 0309  NA 131* 129*  K 3.9 3.6  CL 102 98*  CO2 19* 19*  GLUCOSE 178* 275*  BUN 16 14  CREATININE 1.35* 1.23  CALCIUM 8.3* 8.2*   Liver Function Tests:  Recent Labs Lab 01/01/15 1405  AST 16  ALT 29  ALKPHOS 75  BILITOT 1.6*  PROT 8.4*  ALBUMIN 4.7    Recent Labs Lab 01/01/15 1405  LIPASE 24   CBC:  Recent Labs Lab 01/01/15 1405  WBC 10.0  NEUTROABS 8.4*  HGB 17.6*  HCT 53.6*  MCV 83.8  PLT  276   Cardiac Enzymes:  Recent Labs Lab 01/01/15 1440  TROPONINI <0.03   CBG:  Recent Labs Lab 01/02/15 0854 01/02/15 1011 01/02/15 1117 01/02/15 1202 01/02/15 1619 01/02/15 2132  GLUCAP 186* 171* 173* 161* 367* 348*   Hemoglobin A1C:  Recent Labs Lab 01/01/15 2050  HGBA1C 14.4*   Urinalysis:  Recent Labs Lab 01/01/15 1445  COLORURINE YELLOW  LABSPEC 1.030  PHURINE 5.0  GLUCOSEU >1000*  HGBUR TRACE*  BILIRUBINUR NEGATIVE  KETONESUR >80*  PROTEINUR NEGATIVE  UROBILINOGEN 0.2  NITRITE NEGATIVE  LEUKOCYTESUR NEGATIVE   Micro Results: Recent Results (from the past 240 hour(s))  Urine culture     Status: None   Collection Time: 01/01/15  2:45 PM  Result Value Ref Range Status   Specimen Description URINE, CLEAN CATCH  Final   Special Requests NONE  Final   Culture   Final    NO GROWTH 1 DAY Performed at [facility]    Report Status 01/02/2015 FINAL  Final  MRSA PCR Screening     Status: None   Collection Time: 01/01/15  7:29 PM  Result Value Ref Range Status   MRSA by PCR NEGATIVE NEGATIVE Final    Comment:        The GeneXpert MRSA Assay (FDA approved for  NASAL specimens only), is one component of a comprehensive MRSA colonization surveillance program. It is not intended to diagnose MRSA infection nor to guide or monitor treatment for MRSA infections.    Studies/Results: No results found. Medications: I have reviewed the patient's current medications. Scheduled Meds: . enoxaparin (LOVENOX) injection  40 mg Subcutaneous Q24H  . fluconazole  200 mg Oral Daily  . Influenza vac split quadrivalent PF  0.5 mL Intramuscular Tomorrow-1000  . insulin aspart  0-20 Units Subcutaneous TID WC  . insulin aspart  0-5 Units Subcutaneous QHS  . insulin aspart  6 Units Subcutaneous TID WC  . insulin glargine  25 Units Subcutaneous Daily  . pantoprazole (PROTONIX) IV  40 mg Intravenous Q24H   Continuous Infusions:  PRN Meds:.ondansetron (ZOFRAN)  IV Assessment/Plan: Active Problems:   DKA (diabetic ketoacidoses)   Candidiasis   Diabetic ketoacidosis without coma associated with type 2 diabetes mellitus   Metabolic acidosis  DKA: Resolved. Was transitioned to Lantus 25 units daily with Novolog 4 units qhs and moderate SSI. BG was in the 190s on the insulin drip, increased to 300s last night. 331 this AM. Off IVF.  - Increase Lantus to 30 units daily - Increase Novolog to 6 units qac - Resistant SSI - Stop IVFs - Carb modified diet - Discharge later today - Diabetic education and insulin instructions - Discharge later today on insulin. Close follow up with PCP.  GERD: Patient has h/o GERD, but does not take PPI. No longer complaining of any dysphagia or emesis. Emesis likely 2/2 DKA. - FOBT pending - Continue Protonix  - Zofran prn   Oral/Esophageal Candidiasis: No h/o HIV or other immunosuppression. Patient reports good glycemic control at home. Repeat HIV negative.Likely 2/2 long standing increased blood sugars. Oropharynx clear on exam today. - A1c 14.4 - Continue oral fluconazole for 7 day total course  AKI: Cr 1.94 on admission. Improved to 1.23 today. Baseline appears to be between 1.1-1.2 per chart review.   OSA: CPAP nightly  HLD: Total cholesterol 257, TG 185, HDL 29, LDL 191 on 4/14. Starting Atorvastatin 20 mg.   FEN/GI - Carb modified diet - Protonix  DVT Ppx: Lovenox  Dispo: Ready for discharge today.  The patient does have a current PCP Gust Rung, DO) and does need an [facility] hospital follow-up appointment after discharge.  The patient does not have transportation limitations that hinder transportation to clinic appointments.  .Services Needed at time of discharge: Y = Yes, Blank = No PT:   OT:   RN:   Equipment:   Other:     LOS: 2 days   Valentino Nose, MD 01/03/2015, 6:56 AM

## 2015-01-03 NOTE — Progress Notes (Signed)
Inpatient Diabetes Program Recommendations  AACE/ADA: New Consensus Statement on Inpatient Glycemic Control (2013)  Target Ranges:  Prepandial:   less than 140 mg/dL      Peak postprandial:   less than 180 mg/dL (1-2 hours)      Critically ill patients:  140 - 180 mg/dL   Glycemic Control Recommendations  Results for KASTEN, LEVEQUE (MRN [id number]) as of 01/03/2015 14:02  Ref. Range 01/02/2015 16:19 01/02/2015 21:32 01/03/2015 08:33 01/03/2015 11:32  Glucose-Capillary Latest Ref Range: 65-99 mg/dL 409 (H) 811 (H) 914 (H) 324 (H)  Results for PRICE, LACHAPELLE (MRN [id number]) as of 01/03/2015 14:02  Ref. Range 01/01/2015 20:50  Hemoglobin A1C Latest Ref Range: 4.8-5.6 % 14.4 (H)          Spoke with patient about diabetes and home regimen for diabetes control. Patient reports that his insurance expired in July and he needs an affordable insulin. Discussed diet, exercise, importance of monitoring and f/u with PCP for diabetes management. Recommend 70/30 insulin at [facility], along with Regular insulin for correction. Discussed Hypoglycemia s/s and treatment. Pt was able to give insulin injection at lunch. Feels confident in giving insulin and checking blood sugars at home.   Discussed with RN. Thank you. Ailene Ards, RD, LDN, CDE Inpatient Diabetes Coordinator [phone]

## 2015-01-03 NOTE — Discharge Instructions (Signed)
Mr. Brady Gibbs, you were hospitalized due to your blood sugars being elevated that caused you to go in to Diabetic Ketoacidosis. Your A1c checked during hospitalization was 14.4. Your blood sugars have remained elevated and we will need to send you home on insulin. Since your insurance has expired we will send you home on Humulin NPH 70/30. Take 30 units in the morning and 15 units at night. Please purchase a meter as well and check your blood sugars 3-4 times a day. Bring your meter with you to your clinic visit. Continue taking the Metformin 500 mg XR once daily. After 1 week, start taking it twice a day.   We will also send you home with a prescription for Protonix for the GERD and fluconazole for the candida infection in your mouth.   You have a follow up appointment in clinic on Tuesday 9/13 @ 2:45 PM with Dr. Vivianne Master. Please bring your meter and medications with you to this visit.   Diabetic Ketoacidosis Diabetic ketoacidosis (DKA) is a life-threatening complication of type 1 diabetes. It must be quickly recognized and treated. Treatment requires hospitalization. CAUSES  When there is no insulin in the body, glucose (sugar) cannot be used, and the body breaks down fat for energy. When fat breaks down, acids (ketones) build up in the blood. Very high levels of glucose and high levels of acids lead to severe loss of body fluids (dehydration) and other dangerous chemical changes. This stresses your vital organs and can cause coma or death. SIGNS AND SYMPTOMS   Tiredness (fatigue).  Weight loss.  Excessive thirst.  Ketones in your urine.  Light-headedness.  Fruity or sweet smelling breath.  Excessive urination.  Visual changes.  Confusion or irritability.  Nausea or vomiting.  Rapid breathing.  Stomachache or abdominal pain. DIAGNOSIS  Your health care provider will diagnose DKA based on your history, physical exam, and blood tests. The health care provider will check to see if you  have another illness that caused you to go into DKA. Most of this will be done quickly in an emergency room. TREATMENT   Fluid replacement to correct dehydration.  Insulin.  Correction of electrolytes, such as potassium and sodium.  Antibiotic medicines. PREVENTION  Always take your insulin. Do not skip your insulin injections.  If you are sick, treat yourself quickly. Your body often needs more insulin to fight the illness.  Check your blood glucose regularly.  Check urine ketones if your blood glucose is greater than 240 milligrams per deciliter (mg/dL).  Do not use outdated (expired) insulin.  If your blood glucose is high, drink plenty of fluids. This helps flush out ketones. HOME CARE INSTRUCTIONS   If you are sick, follow the advice of your health care provider.  To prevent dehydration, drink enough water and fluids to keep your urine clear or pale yellow.  If you cannot eat, alternate between drinking fluids with sugar (soda, juices, flavored gelatin) and salty fluids (broth, bouillon).  If you can eat, follow your usual diet and drink sugar-free liquids (water, diet drinks).  Always take your usual dose of insulin. If you cannot eat or if your glucose is getting too low, call your health care provider for further instructions.  Continue to monitor your blood or urine ketones every 3-4 hours around the clock. Set your alarm clock or have someone wake you up. If you are too sick, have someone test it for you.  Rest and avoid exercise. SEEK MEDICAL CARE IF:   You  have a fever.  You have ketones in your urine, or your blood glucose is higher than a level your health care provider suggests. You may need extra insulin. Call your health care provider if you need advice on adjusting your insulin.  You cannot drink at least a tablespoon (15 mL) of fluid every 15-20 minutes.  You have been vomiting for more than 2 hours.  You have symptoms of DKA:  Fruity smelling  breath.  Breathing faster or slower.  Becoming very sleepy. SEEK IMMEDIATE MEDICAL CARE IF:   You have signs of dehydration:  Decreased urination.  Increased thirst.  Dry skin and mouth.  Light-headedness.  Your blood glucose is very high (as advised by your health care provider) twice in a row.  You faint.  You have chest pain or trouble breathing.  You have a sudden, severe headache.  You have sudden weakness in one arm or one leg.  You have sudden trouble speaking or swallowing.  You have vomiting or diarrhea that is getting worse after 3 hours.  You have abdominal pain. MAKE SURE YOU:   Understand these instructions.  Will watch your condition.  Will get help right away if you are not doing well or get worse. Document Released: 04/10/2000 Document Revised: 04/18/2013 Document Reviewed: 10/17/2008 [facility] Patient Information 2015 [place], [place]. This information is not intended to replace advice given to you by your health care provider. Make sure you discuss any questions you have with your health care provider.

## 2015-01-03 NOTE — Progress Notes (Signed)
Brady Gibbs to be D/C'd Home per MD order.  Discussed with the patient and all questions fully answered.  VSS, Skin clean, dry and intact without evidence of skin break down, no evidence of skin tears noted. IV catheter discontinued intact. Site without signs and symptoms of complications. Dressing and pressure applied.  An After Visit Summary was printed and given to the patient. Patient received prescription.  D/c education completed with patient/family including follow up instructions, medication list, d/c activities limitations if indicated, with other d/c instructions as indicated by MD - patient able to verbalize understanding, all questions fully answered.   Patient instructed to return to ED, call 911, or call MD for any changes in condition.   Patient escorted via WC, and D/C home via private auto with mom.  [facility] 01/03/2015 3:50 PM

## 2015-01-03 NOTE — Progress Notes (Signed)
Pt did return demonstration of drawing up insulin from vial, injecting insulin,

## 2015-01-04 ENCOUNTER — Telehealth: Payer: Self-pay | Admitting: Dietician

## 2015-01-04 NOTE — Telephone Encounter (Signed)
Patient called wanting to know how to go about getting insulin. CDE explained that he did not need a prescription at [facility] to get ReliOn 70/30. He says his blood sugar "spiked" today after eating breakfast into the 300s. He knew to roll the vial before he draws the insulin out to mix the insulin.  We discussed drinking an extra bottle/glass of water for each 100 mg/dl > 161.  Patient verbalized understating of the above information using teach back and agreed to call for additional questions or concerns.   Noted after we hung up that orders giving to him on discharge were to take his ReliOn 70/30 insulin before breakfast and bedtime, called back and spoke to Brady Gibbs. We discussed rational for taking his insulin before meals at breakfast and dinner. He was also provided with the after hours resident on call phone number. He again verbalized understanding and said "I will be all right"

## 2015-01-07 NOTE — Discharge Summary (Signed)
Name: Brady Gibbs MRN: [id number] DOB: 1980-09-07 34 y.o. PCP: Gust Rung, DO  Date of Admission: 01/01/2015  1:58 PM Date of Discharge: 01/07/2015 Attending Physician: No att. providers found  Discharge Diagnosis: Active Problems:   DKA (diabetic ketoacidoses)   Candidiasis   Diabetic ketoacidosis without coma associated with type 2 diabetes mellitus   Metabolic acidosis  Discharge Medications:   Medication List    STOP taking these medications        amitriptyline 25 MG tablet  Commonly known as:  ELAVIL     zolpidem 12.5 MG CR tablet  Commonly known as:  AMBIEN CR      TAKE these medications        atorvastatin 20 MG tablet  Commonly known as:  LIPITOR  Take 1 tablet (20 mg total) by mouth daily at 6 PM.     fluconazole 200 MG tablet  Commonly known as:  DIFLUCAN  Take 1 tablet (200 mg total) by mouth daily.     insulin NPH-regular Human (70-30) 100 UNIT/ML injection  Commonly known as:  NOVOLIN 70/30  Inject 30 units under the skin with food in the morning. Inject 15 units under the skin before bed.     metFORMIN 500 MG 24 hr tablet  Commonly known as:  GLUCOPHAGE XR  Take 1 tablet (500 mg total) by mouth daily with breakfast. Increase to twice a day after 1 week.     pantoprazole 40 MG tablet  Commonly known as:  PROTONIX  Take 1 tablet (40 mg total) by mouth daily.     podofilox 0.5 % gel  Commonly known as:  CONDYLOX  Apply topically 2 (two) times daily. Apply for 3 consecutive days followed by 4 days without treatment. Repeat as needed for up to 4 weeks.        Disposition and follow-up:   Brady.Everett Gibbs was discharged from [facility] in Stable condition.  At the hospital follow up visit please address:  1.  Brady. Gibbs presented with DKA. A1c 14.4. Responded well to IVF and insulin. Discharged on with NPH 70/30 30 units qam and 15 units qhs. Please assess his blood sugar control and titrate his insulin regimen as needed. He is  currently taking Metfromin 500 XR daily as well, will increase to bid after one week.  2. He also had oral candidiasis. Improved with fluconazole. Sent home with 1 week treatment. Please assess that infection has resolved.  3. GERD symptoms. Started on Protonix. Please assess compliance and effectiveness.   4. Started Atorvastain 20 mg daily. Please assess compliacne and effectiveness.  5. Labs / imaging needed at time of follow-up: None 6. Pending labs/ test needing follow-up: None  Follow-up Appointments:     Follow-up Information    Follow up with Gara Kroner, MD On 01/08/2015.   Specialty:  Internal Medicine   Why:  @ 2:45 PM   Contact information:   [address] [place] [address] [phone]       Discharge Instructions: Discharge Instructions    Call MD for:  persistant nausea and vomiting    Complete by:  As directed      Call MD for:  temperature >100.4    Complete by:  As directed      Diet - low sodium heart healthy    Complete by:  As directed      Increase activity slowly    Complete by:  As directed  Consultations:  None  Procedures Performed:  No results found.   Admission HPI: Brady. Gibbs is a 34 yo male with DMII and OSA, presenting with 3-4 day history of decreased appetite, nausea, polyuria, and polydipsia. He last checked his CBG 2-3 days ago, at which time it was 130. He is usually in the 140s, and rarely greater than 200. His diabetes has been managed with diet, with intermittent use of his Metformin, reportedly per his PCP. Patient reports not having eaten a full meal for 3-4 days, but has been drinking Body Armor sports drinks to stay hydrated. He has also been drinking lots of fluids, but endorses frequent urination. He endorses fatigue, being too tired to walk home 1.5 miles, with shortness of breath. He denies abdominal pain. He denies recent illness, though he endorses occasional subjective fevers and chills.  He also  endorses 1 episode of coffee ground emesis today. This has never happened before. He has no h/o ulcers. He denies abdominal pain associated with food. He denies excessive alcohol intake (occasional 1 beer) and denies NSAID use. The patient also has a h/o heartburn and epigastric pain. He has taken a PPI in the past, but has not taken it for years because his symptoms had relieved. However, he currently endorses frequent belching and susbsternal burning for months. He denies odynophagia, but endorses frequent sensation of food getting stuck in his throat. He occasionally will cough chewed food back up. He has never had an EGD.  In the ED, his initial blood glucose was 802, with AG of 26, and bicarb 11. His blood gas was notable for pH 7.2, CO2 25.5, O2 83, and sat 94% (listed as vBG). There were glucose and ketones in the urine.  Hospital Course by problem list: Active Problems:   DKA (diabetic ketoacidoses)   Candidiasis   Diabetic ketoacidosis without coma associated with type 2 diabetes mellitus   Metabolic acidosis   DKA: Patient's initial glucose of 802 and AG fo 26, corrected after initiation of insulin drip. Other than oral thrush, patient did not have any obvious triggers for current episode.His last A1c in April 2016 was 6.7. Only taking Metformin 500 XR daily at home. He had no signs or symptoms of infection and no infectious exposures. He had a decreased appetite for the previous 4 days with associated nausea. He began drinking large volumes of sugary sports drinks and his blood sugars started increasing. He admits to polyuria and polydipsia. No further episodes of emesis since arrival to hospital. He repsonded well to insulin drip and IVFs with CBGs decreasing to the upper 100s. He was transitioned to Lantus 25 units qhs and Novolog 4 untis qac with moderate SSI. Repeat A1c was 14.4. CBGs went back up into the 300s. We increased his Lantus to 30 units and Novolog to 6 units and  started resistant SSI. Initially planned to discharge on Lantus and Novolog regimen but patient reported he lost his insurance in July. Norm Gibbs, the [facility] nutritionist and diabetic educator, spoke with the patient. They discussed his options and OTC insulin at [facility] would be the most cost effective for him. Discharged him on NPH 70/30 30 units at breakfast and 15 units at night and metformin XR 500 mg once a day x 1 week and to increase to bid after that. He reports he will purchase a meter at [facility] and check his blood sugars 3-4 times a day. He plans to apply for orange card until he gets insurance.  GERD: Patient has h/o GERD, but does not take PPI. Complained of substernal burning, dysphagia, occasional regurgitation of chewed food and one episode of reported coffee ground emesis. Symptoms were concerning for PUD, Barrett's esophagus, and/or stricture. Patient has never had EGD.Symptoms improved after starting Protonix.  Discharged with Protonix 40 mg daily.   Oral/Esophageal Candidiasis: No h/o HIV or other immunosuppression. Patient reported good glycemic control at home until the last several days.HIV negative. Oropharynx cleared after one dose of IV fluconazole. Discharged with oral fluconazole for 7 day total course.   HLD: Lipid panel with Total Cholesterol 257, TG 185, HDL 29, LDL 191 on 4/14. Started Atorvastatin 20 mg daily.   AKI: Cr was 1.94 on admission. Cr improved to 1.23 following IVF. Baseline is around 1.1 to 1.2 per chart review.   OSA: Continued CPAP nightly  Discharge Vitals:   BP 120/62 mmHg  Pulse 81  Temp(Src) 98.3 F (36.8 C) (Oral)  Resp 17  Ht 6' (1.829 m)  Wt 279 lb 15.8 oz (127 kg)  BMI 37.96 kg/m2  SpO2 99%  Discharge Labs:  No results found for this or any previous visit (from the past 24 hour(s)).  Signed: Valentino Nose, MD 01/07/2015, 8:37 PM

## 2015-01-08 ENCOUNTER — Ambulatory Visit: Payer: 59 | Admitting: Internal Medicine

## 2015-01-10 ENCOUNTER — Encounter: Payer: Self-pay | Admitting: Internal Medicine

## 2015-01-11 ENCOUNTER — Telehealth: Payer: Self-pay | Admitting: *Deleted

## 2015-01-11 NOTE — Telephone Encounter (Signed)
Transition Care Management Follow-up Telephone Call   Date discharged?   How have you been since you were released from the hospita   Do you understand why you were in the hospital?   Do you understand the discharge instructions?    Where were you discharged to?    Items Reviewed:  Medications reviewed:   Allergies reviewed:   Dietary changes reviewed:   Referrals reviewed:    Functional Questionnaire:   Activities of Daily Living (ADLs):   He states they are independent in the following: could not speak to pt States they require assistance with the following: see prior   Any transportation issues/concerns?:    Any patient concerns?    Confirmed importance and date/time of follow-up visits scheduled   Provider Appointment booked with  Confirmed with patient if condition begins to worsen call PCP or go to the ER.  Patient was given the office number and encouraged to call back with question or concerns.     Pt was called at [phone] and a woman stated he would call back to talk to triage, call was ended

## 2015-01-29 NOTE — Telephone Encounter (Signed)
No answer 9/19 and 9/20

## 2015-05-30 ENCOUNTER — Encounter: Payer: Self-pay | Admitting: Internal Medicine

## 2015-07-26 ENCOUNTER — Telehealth: Payer: Self-pay | Admitting: Internal Medicine

## 2015-07-26 NOTE — Telephone Encounter (Signed)
HM letter returned due to incorrect address, [address]

## 2015-08-17 ENCOUNTER — Emergency Department (HOSPITAL_COMMUNITY)
Admission: EM | Admit: 2015-08-17 | Discharge: 2015-08-17 | Disposition: A | Discharge status: Home / Self Care | Payer: Self-pay | Attending: Emergency Medicine | Admitting: Emergency Medicine

## 2015-08-17 ENCOUNTER — Encounter (HOSPITAL_COMMUNITY): Payer: Self-pay | Admitting: *Deleted

## 2015-08-17 DIAGNOSIS — E119 Type 2 diabetes mellitus without complications: Secondary | ICD-10-CM | POA: Insufficient documentation

## 2015-08-17 DIAGNOSIS — Z79899 Other long term (current) drug therapy: Secondary | ICD-10-CM | POA: Insufficient documentation

## 2015-08-17 DIAGNOSIS — F17210 Nicotine dependence, cigarettes, uncomplicated: Secondary | ICD-10-CM | POA: Insufficient documentation

## 2015-08-17 DIAGNOSIS — Z794 Long term (current) use of insulin: Secondary | ICD-10-CM | POA: Insufficient documentation

## 2015-08-17 DIAGNOSIS — G4733 Obstructive sleep apnea (adult) (pediatric): Secondary | ICD-10-CM | POA: Insufficient documentation

## 2015-08-17 DIAGNOSIS — R0600 Dyspnea, unspecified: Secondary | ICD-10-CM | POA: Insufficient documentation

## 2015-08-17 DIAGNOSIS — Z7984 Long term (current) use of oral hypoglycemic drugs: Secondary | ICD-10-CM | POA: Insufficient documentation

## 2015-08-17 DIAGNOSIS — R1310 Dysphagia, unspecified: Secondary | ICD-10-CM | POA: Insufficient documentation

## 2015-08-17 LAB — CBG MONITORING, ED: Glucose-Capillary: 91 mg/dL (ref 65–99)

## 2015-08-17 MED ORDER — DEXAMETHASONE 4 MG PO TABS
10.0000 mg | ORAL_TABLET | Freq: Once | ORAL | Status: AC
  Administered 2015-08-17: 10 mg via ORAL
  Filled 2015-08-17: qty 3

## 2015-08-17 NOTE — Discharge Instructions (Signed)
Sleep Apnea  Sleep apnea is a sleep disorder characterized by abnormal pauses in breathing while you sleep. When your breathing pauses, the level of oxygen in your blood decreases. This causes you to move out of deep sleep and into light sleep. As a result, your quality of sleep is poor, and the system that carries your blood throughout your body (cardiovascular system) experiences stress. If sleep apnea remains untreated, the following conditions can develop:  High blood pressure (hypertension).  Coronary artery disease.  Inability to achieve or maintain an erection (impotence).  Impairment of your thought process (cognitive dysfunction). There are three types of sleep apnea:  Obstructive sleep apnea--Pauses in breathing during sleep because of a blocked airway.  Central sleep apnea--Pauses in breathing during sleep because the area of the brain that controls your breathing does not send the correct signals to the muscles that control breathing.  Mixed sleep apnea--A combination of both obstructive and central sleep apnea. RISK FACTORS The following risk factors can increase your risk of developing sleep apnea:  Being overweight.  Smoking.  Having narrow passages in your nose and throat.  Being of older age.  Being male.  Alcohol use.  Sedative and tranquilizer use.  Ethnicity. Among individuals younger than 35 years, African Americans are at increased risk of sleep apnea. SYMPTOMS   Difficulty staying asleep.  Daytime sleepiness and fatigue.  Loss of energy.  Irritability.  Loud, heavy snoring.  Morning headaches.  Trouble concentrating.  Forgetfulness.  Decreased interest in sex.  Unexplained sleepiness. DIAGNOSIS  In order to diagnose sleep apnea, your caregiver will perform a physical examination. A sleep study done in the comfort of your own home may be appropriate if you are otherwise healthy. Your caregiver may also recommend that you spend the night  in a sleep lab. In the sleep lab, several monitors record information about your heart, lungs, and brain while you sleep. Your leg and arm movements and blood oxygen level are also recorded. TREATMENT The following actions may help to resolve mild sleep apnea:  Sleeping on your side.   Using a decongestant if you have nasal congestion.   Avoiding the use of depressants, including alcohol, sedatives, and narcotics.   Losing weight and modifying your diet if you are overweight. There also are devices and treatments to help open your airway:  Oral appliances. These are custom-made mouthpieces that shift your lower jaw forward and slightly open your bite. This opens your airway.  Devices that create positive airway pressure. This positive pressure "splints" your airway open to help you breathe better during sleep. The following devices create positive airway pressure:  Continuous positive airway pressure (CPAP) device. The CPAP device creates a continuous level of air pressure with an air pump. The air is delivered to your airway through a mask while you sleep. This continuous pressure keeps your airway open.  Nasal expiratory positive airway pressure (EPAP) device. The EPAP device creates positive air pressure as you exhale. The device consists of single-use valves, which are inserted into each nostril and held in place by adhesive. The valves create very little resistance when you inhale but create much more resistance when you exhale. That increased resistance creates the positive airway pressure. This positive pressure while you exhale keeps your airway open, making it easier to breath when you inhale again.  Bilevel positive airway pressure (BPAP) device. The BPAP device is used mainly in patients with central sleep apnea. This device is similar to the CPAP device because  it also uses an air pump to deliver continuous air pressure through a mask. However, with the BPAP machine, the pressure  is set at two different levels. The pressure when you exhale is lower than the pressure when you inhale.  Surgery. Typically, surgery is only done if you cannot comply with less invasive treatments or if the less invasive treatments do not improve your condition. Surgery involves removing excess tissue in your airway to create a wider passage way.   This information is not intended to replace advice given to you by your health care provider. Make sure you discuss any questions you have with your health care provider.   Document Released: 04/03/2002 Document Revised: 05/04/2014 Document Reviewed: 08/20/2011 Elsevier Interactive Patient Education 2016 [employer] Gastroesophageal Reflux Disease, Adult Normally, food travels down the esophagus and stays in the stomach to be digested. However, when a person has gastroesophageal reflux disease (GERD), food and stomach acid move back up into the esophagus. When this happens, the esophagus becomes sore and inflamed. Over time, GERD can create small holes (ulcers) in the lining of the esophagus.  CAUSES This condition is caused by a problem with the muscle between the esophagus and the stomach (lower esophageal sphincter, or LES). Normally, the LES muscle closes after food passes through the esophagus to the stomach. When the LES is weakened or abnormal, it does not close properly, and that allows food and stomach acid to go back up into the esophagus. The LES can be weakened by certain dietary substances, medicines, and medical conditions, including:  Tobacco use.  Pregnancy.  Having a hiatal hernia.  Heavy alcohol use.  Certain foods and beverages, such as coffee, chocolate, onions, and peppermint. RISK FACTORS This condition is more likely to develop in:  People who have an increased body weight.  People who have connective tissue disorders.  People who use NSAID medicines. SYMPTOMS Symptoms of this condition  include:  Heartburn.  Difficult or painful swallowing.  The feeling of having a lump in the throat.  Abitter taste in the mouth.  Bad breath.  Having a large amount of saliva.  Having an upset or bloated stomach.  Belching.  Chest pain.  Shortness of breath or wheezing.  Ongoing (chronic) cough or a night-time cough.  Wearing away of tooth enamel.  Weight loss. Different conditions can cause chest pain. Make sure to see your health care provider if you experience chest pain. DIAGNOSIS Your health care provider will take a medical history and perform a physical exam. To determine if you have mild or severe GERD, your health care provider may also monitor how you respond to treatment. You may also have other tests, including:  An endoscopy toexamine your stomach and esophagus with a small camera.  A test thatmeasures the acidity level in your esophagus.  A test thatmeasures how much pressure is on your esophagus.  A barium swallow or modified barium swallow to show the shape, size, and functioning of your esophagus. TREATMENT The goal of treatment is to help relieve your symptoms and to prevent complications. Treatment for this condition may vary depending on how severe your symptoms are. Your health care provider may recommend:  Changes to your diet.  Medicine.  Surgery. HOME CARE INSTRUCTIONS Diet  Follow a diet as recommended by your health care provider. This may involve avoiding foods and drinks such as:  Coffee and tea (with or without caffeine).  Drinks that containalcohol.  Energy drinks and sports drinks.  Carbonated drinks  or sodas.  Chocolate and cocoa.  Peppermint and mint flavorings.  Garlic and onions.  Horseradish.  Spicy and acidic foods, including peppers, chili powder, curry powder, vinegar, hot sauces, and barbecue sauce.  Citrus fruit juices and citrus fruits, such as oranges, lemons, and limes.  Tomato-based foods, such as  red sauce, chili, salsa, and pizza with red sauce.  Fried and fatty foods, such as donuts, french fries, potato chips, and high-fat dressings.  High-fat meats, such as hot dogs and fatty cuts of red and white meats, such as rib eye steak, sausage, ham, and bacon.  High-fat dairy items, such as whole milk, butter, and cream cheese.  Eat small, frequent meals instead of large meals.  Avoid drinking large amounts of liquid with your meals.  Avoid eating meals during the 2-3 hours before bedtime.  Avoid lying down right after you eat.  Do not exercise right after you eat. General Instructions  Pay attention to any changes in your symptoms.  Take over-the-counter and prescription medicines only as told by your health care provider. Do not take aspirin, ibuprofen, or other NSAIDs unless your health care provider told you to do so.  Do not use any tobacco products, including cigarettes, chewing tobacco, and e-cigarettes. If you need help quitting, ask your health care provider.  Wear loose-fitting clothing. Do not wear anything tight around your waist that causes pressure on your abdomen.  Raise (elevate) the head of your bed 6 inches (15cm).  Try to reduce your stress, such as with yoga or meditation. If you need help reducing stress, ask your health care provider.  If you are overweight, reduce your weight to an amount that is healthy for you. Ask your health care provider for guidance about a safe weight loss goal.  Keep all follow-up visits as told by your health care provider. This is important. SEEK MEDICAL CARE IF:  You have new symptoms.  You have unexplained weight loss.  You have difficulty swallowing, or it hurts to swallow.  You have wheezing or a persistent cough.  Your symptoms do not improve with treatment.  You have a hoarse voice. SEEK IMMEDIATE MEDICAL CARE IF:  You have pain in your arms, neck, jaw, teeth, or back.  You feel sweaty, dizzy, or  light-headed.  You have chest pain or shortness of breath.  You vomit and your vomit looks like blood or coffee grounds.  You faint.  Your stool is bloody or black.  You cannot swallow, drink, or eat.   This information is not intended to replace advice given to you by your health care provider. Make sure you discuss any questions you have with your health care provider.   Document Released: 01/21/2005 Document Revised: 01/02/2015 Document Reviewed: 08/08/2014 Elsevier Interactive Patient Education [employer]

## 2015-08-17 NOTE — ED Notes (Addendum)
Pt reports having a sore throat x 1 week. Reports having difficulty swallowing and feels sob when sleeping, feels like throat is closing. Denies fever. Airway intact at triage, no resp distress noted.

## 2015-08-17 NOTE — ED Notes (Signed)
Pt stable, ambulatory, states understanding of discharge instructions 

## 2015-08-17 NOTE — ED Notes (Signed)
CBG 91 

## 2015-08-17 NOTE — ED Provider Notes (Signed)
CSN: [id number]     Arrival date & time 08/17/15  1325 History   First MD Initiated Contact with Patient 08/17/15 1500     Chief Complaint  Patient presents with  . Sore Throat     (Consider location/radiation/quality/duration/timing/severity/associated sxs/prior Treatment) Patient is a 35 y.o. male presenting with pharyngitis. The history is provided by the patient.  Sore Throat This is a chronic problem. Episode onset: 5 years ago. The problem occurs constantly. The problem has not changed since onset.Pertinent negatives include no chest pain, no abdominal pain and no shortness of breath. Nothing aggravates the symptoms. Nothing relieves the symptoms. He has tried nothing for the symptoms.    Past Medical History  Diagnosis Date  . Diabetes mellitus without complication (HCC)   . Sleep apnea    Past Surgical History  Procedure Laterality Date  . Hernia repair      umbical hernia repair at 50 or 35 years old.  . Foot surgery Right  at age 35    cyst removal   History reviewed. No pertinent family history. Social History  Substance Use Topics  . Smoking status: Current Every Day Smoker -- 0.50 packs/day    Types: Cigarettes  . Smokeless tobacco: None  . Alcohol Use: No    Review of Systems  Respiratory: Positive for choking (at night especially when sleeping). Negative for shortness of breath.   Cardiovascular: Negative for chest pain.  Gastrointestinal: Negative for abdominal pain.  All other systems reviewed and are negative.     Allergies  Review of patient's allergies indicates no known allergies.  Home Medications   Prior to Admission medications   Medication Sig Start Date End Date Taking? Authorizing Provider  Loratadine (CLARITIN) 10 MG CAPS Take 1 capsule by mouth daily as needed (for allergies).   Yes Historical Provider, MD  metFORMIN (GLUCOPHAGE XR) 500 MG 24 hr tablet Take 1 tablet (500 mg total) by mouth daily with breakfast. Increase to twice a day  after 1 week. 01/03/15  Yes Valentino Nose, MD  pantoprazole (PROTONIX) 40 MG tablet Take 1 tablet (40 mg total) by mouth daily. 08/09/14 08/17/15 Yes Gust Rung, DO  atorvastatin (LIPITOR) 20 MG tablet Take 1 tablet (20 mg total) by mouth daily at 6 PM. 01/03/15   Valentino Nose, MD  fluconazole (DIFLUCAN) 200 MG tablet Take 1 tablet (200 mg total) by mouth daily. 01/03/15   Valentino Nose, MD  insulin NPH-regular Human (NOVOLIN 70/30) (70-30) 100 UNIT/ML injection Inject 30 units under the skin with food in the morning. Inject 15 units under the skin before bed. 01/03/15   Valentino Nose, MD  podofilox (CONDYLOX) 0.5 % gel Apply topically 2 (two) times daily. Apply for 3 consecutive days followed by 4 days without treatment. Repeat as needed for up to 4 weeks. Patient taking differently: Apply 1 application topically 2 (two) times daily as needed (affected area). Apply for 3 consecutive days followed by 4 days without treatment. Repeat as needed for up to 4 weeks. 08/09/14   Gust Rung, DO   BP 146/95 mmHg  Pulse 90  Temp(Src) 98.7 F (37.1 C) (Oral)  Resp 18  SpO2 99% Physical Exam  Constitutional: He is oriented to person, place, and time. He appears well-developed and well-nourished. No distress.  HENT:  Head: Normocephalic and atraumatic.  Mouth/Throat: Oropharynx is clear and moist.  Eyes: Conjunctivae are normal.  Neck: Neck supple. No tracheal deviation present.  Cardiovascular: Normal rate, regular rhythm and normal heart  sounds.   Pulmonary/Chest: Effort normal and breath sounds normal. No respiratory distress.  Abdominal: Soft. He exhibits no distension. There is no tenderness.  Neurological: He is alert and oriented to person, place, and time.  Skin: Skin is warm and dry.  Psychiatric: He has a normal mood and affect.  Vitals reviewed.   ED Course  Procedures (including critical care time) Labs Review Labs Reviewed - No data to display  Imaging Review No results  found. I have personally reviewed and evaluated these images and lab results as part of my medical decision-making.   EKG Interpretation None      MDM   Final diagnoses:  OSA (obstructive sleep apnea)  Swallowing difficulty  Paroxysmal nocturnal dyspnea    35 y.o. male presents with ongoing difficulty at night especially with feeling like his throat is swollen. Has known OSA and has been compliant with CPAP but lost to f/u. Throat exam is normal. Has not been taking metformin because his blood sugars have been running normally since recent weight loss. Provided decadron for feeling of throat discomfort, but appears he may need adjustment of CPAP settings because this is worse right after sleep. Handling secretions normally and breathing appropriately currently without any stridor or abnormal findings. Plan to follow up with PCP as needed and return precautions discussed for worsening or new concerning symptoms.    Lyndal Pulleyaniel Jashira Cotugno, MD 08/18/15 [phone]

## 2015-12-25 ENCOUNTER — Telehealth: Payer: Self-pay | Admitting: Internal Medicine

## 2015-12-25 NOTE — Telephone Encounter (Signed)
APT. REMINDER CALL, NO ANSWER, NO VOICEMAIL °

## 2015-12-26 ENCOUNTER — Encounter: Payer: Self-pay | Admitting: Internal Medicine

## 2015-12-26 ENCOUNTER — Encounter: Payer: Self-pay | Admitting: Dietician

## 2016-01-01 ENCOUNTER — Encounter (HOSPITAL_COMMUNITY): Payer: Self-pay | Admitting: *Deleted

## 2016-01-01 ENCOUNTER — Emergency Department (HOSPITAL_COMMUNITY)
Admission: EM | Admit: 2016-01-01 | Discharge: 2016-01-01 | Disposition: A | Discharge status: Home / Self Care | Payer: Self-pay | Attending: Emergency Medicine | Admitting: Emergency Medicine

## 2016-01-01 DIAGNOSIS — Z720 Tobacco use: Secondary | ICD-10-CM

## 2016-01-01 DIAGNOSIS — Z794 Long term (current) use of insulin: Secondary | ICD-10-CM | POA: Insufficient documentation

## 2016-01-01 DIAGNOSIS — Z046 Encounter for general psychiatric examination, requested by authority: Secondary | ICD-10-CM

## 2016-01-01 DIAGNOSIS — F17210 Nicotine dependence, cigarettes, uncomplicated: Secondary | ICD-10-CM | POA: Insufficient documentation

## 2016-01-01 DIAGNOSIS — E119 Type 2 diabetes mellitus without complications: Secondary | ICD-10-CM | POA: Insufficient documentation

## 2016-01-01 DIAGNOSIS — R258 Other abnormal involuntary movements: Secondary | ICD-10-CM | POA: Insufficient documentation

## 2016-01-01 NOTE — ED Provider Notes (Signed)
[facility]-EMERGENCY DEPT Provider Note   CSN: [id number] Arrival date & time: 01/01/16  1444     History   Chief Complaint Chief Complaint  Patient presents with  . IVC    HPI Brady Gibbs is a 35 y.o. male with a PMHx of DM2, sleep apnea, HLD, and obesity, who presents to the ED brought in by [facility] after pt's mother took out IVC paperwork on him stating that he was "a danger to self and others, to wit: petitioner states that she believes respondent is hallucinating and/or hearing voices as evidenced by his talking out loud and making hand gestures as if he is holding a conversation with someone; becomes hostile when petitioner tries to talk to him about his problems". [employer] reported that while he has been in the custody, he has been very calm and cooperative, and has not seemed to be having any hallucinations. Patient denies that he has had any hallucinations, and denies any history of schizophrenia or bipolar. States that he has had anxiety in the past but does not take anything for it. Denies SI/HI, with drug use, or alcohol use. Smokes cigarettes. Denies any medical complaints at this time. States that he is not really sure why he is here today, says "I guess someone thought I needed to see a doctor". He states that he hasn't had his A1C checked in a while and wants that to be done here, and admits that he hasn't had his medications in a while because he hasn't seen his PCP. Says that he was previously on Metformin but was stopped on that because his A1C was better.   Gives me his mother's phone number Derinda Late, [phone]). Collateral information from his mother after calling her on the above listed number: she tells me that about 1 month ago she feels like he "had a mental break down", wasn't sleeping as well at night, spent quite a bit of time walking around at night, she could hear him from her room and states he was having a conversation with "someone", making hand gestures like he's  taking to someone but no one is there. She states that she feels like he needs to be seen by a psychiatrist to "get right". She denies that he's made any threats of HI/SI, and has not been aggressive towards anyone. She denies that he's ever been diagnosed with any psychiatric condition aside from anxiety. States that he spends quite a bit of time eating.    The history is provided by the patient and medical records. No language interpreter was used.    Past Medical History:  Diagnosis Date  . Diabetes mellitus without complication (HCC)   . Sleep apnea     Patient Active Problem List   Diagnosis Date Noted  . DKA (diabetic ketoacidoses) (HCC) 01/01/2015  . Candidiasis   . Diabetic ketoacidosis without coma associated with type 2 diabetes mellitus (HCC)   . Metabolic acidosis   . Insomnia 08/10/2014  . Onychomycosis of right great toe 08/10/2014  . Body mass index (BMI) of 40.0-44.9 in adult (HCC) 08/10/2014  . Hyperlipidemia 09/28/2012  . Condyloma acuminata 09/27/2012  . Severe obstructive sleep apnea 06/15/2012  . Diabetes mellitus, type 2 (HCC) 04/15/2012    Past Surgical History:  Procedure Laterality Date  . FOOT SURGERY Right  at age 34   cyst removal  . HERNIA REPAIR     umbical hernia repair at 36 or 35 years old.       Home Medications  Prior to Admission medications   Medication Sig Start Date End Date Taking? Authorizing Provider  atorvastatin (LIPITOR) 20 MG tablet Take 1 tablet (20 mg total) by mouth daily at 6 PM. 01/03/15   Valentino NoseNathan Boswell, MD  fluconazole (DIFLUCAN) 200 MG tablet Take 1 tablet (200 mg total) by mouth daily. 01/03/15   Valentino NoseNathan Boswell, MD  insulin NPH-regular Human (NOVOLIN 70/30) (70-30) 100 UNIT/ML injection Inject 30 units under the skin with food in the morning. Inject 15 units under the skin before bed. 01/03/15   Valentino NoseNathan Boswell, MD  Loratadine (CLARITIN) 10 MG CAPS Take 1 capsule by mouth daily as needed (for allergies).    Historical  Provider, MD  metFORMIN (GLUCOPHAGE XR) 500 MG 24 hr tablet Take 1 tablet (500 mg total) by mouth daily with breakfast. Increase to twice a day after 1 week. 01/03/15   Valentino NoseNathan Boswell, MD  pantoprazole (PROTONIX) 40 MG tablet Take 1 tablet (40 mg total) by mouth daily. 08/09/14 08/17/15  Gust RungErik C Hoffman, DO  podofilox (CONDYLOX) 0.5 % gel Apply topically 2 (two) times daily. Apply for 3 consecutive days followed by 4 days without treatment. Repeat as needed for up to 4 weeks. Patient taking differently: Apply 1 application topically 2 (two) times daily as needed (affected area). Apply for 3 consecutive days followed by 4 days without treatment. Repeat as needed for up to 4 weeks. 08/09/14   Gust RungErik C Hoffman, DO    Family History No family history on file.  Social History Social History  Substance Use Topics  . Smoking status: Current Every Day Smoker    Packs/day: 0.50    Types: Cigarettes  . Smokeless tobacco: Never Used  . Alcohol use No     Allergies   Review of patient's allergies indicates no known allergies.   Review of Systems Review of Systems  Constitutional: Negative for chills and fever.  Respiratory: Negative for shortness of breath.   Cardiovascular: Negative for chest pain.  Gastrointestinal: Negative for abdominal pain, constipation, diarrhea, nausea and vomiting.  Genitourinary: Negative for dysuria and hematuria.  Musculoskeletal: Negative for arthralgias and myalgias.  Skin: Negative for color change.  Allergic/Immunologic: Positive for immunocompromised state (diabetic).  Neurological: Negative for weakness and numbness.  Psychiatric/Behavioral: Positive for hallucinations (per IVC paperwork, but pt denies). Negative for confusion and suicidal ideas.   10 Systems reviewed and are negative for acute change except as noted in the HPI.   Physical Exam Updated Vital Signs BP 121/85 (BP Location: Left Arm)   Pulse 101   Temp 98.3 F (36.8 C) (Oral)   Resp 18    SpO2 100%   Physical Exam  Constitutional: He is oriented to person, place, and time. Vital signs are normal. He appears well-developed and well-nourished.  Non-toxic appearance. No distress.  Afebrile, nontoxic, NAD  HENT:  Head: Normocephalic and atraumatic.  Mouth/Throat: Oropharynx is clear and moist and mucous membranes are normal.  Eyes: Conjunctivae and EOM are normal. Right eye exhibits no discharge. Left eye exhibits no discharge.  Neck: Normal range of motion. Neck supple.  Cardiovascular: Normal rate, regular rhythm, normal heart sounds and intact distal pulses.  Exam reveals no gallop and no friction rub.   No murmur heard. Pulmonary/Chest: Effort normal and breath sounds normal. No respiratory distress. He has no decreased breath sounds. He has no wheezes. He has no rhonchi. He has no rales.  Abdominal: Soft. Normal appearance and bowel sounds are normal. He exhibits no distension. There is no tenderness.  There is no rigidity, no rebound, no guarding, no CVA tenderness, no tenderness at McBurney's point and negative Murphy's sign.  Musculoskeletal: Normal range of motion.  Neurological: He is alert and oriented to person, place, and time. He has normal strength. No sensory deficit.  Skin: Skin is warm, dry and intact. No rash noted.  Psychiatric: He has a normal mood and affect. His speech is normal and behavior is normal. Thought content normal. He is not actively hallucinating. He expresses no homicidal and no suicidal ideation. He expresses no suicidal plans and no homicidal plans.  Very calm and cooperative, pleasant and well-spoken, not responding to internal stimuli, denies SI/HI/AVH.   Nursing note and vitals reviewed.    ED Treatments / Results  Labs (all labs ordered are listed, but only abnormal results are displayed) Labs Reviewed - No data to display  EKG  EKG Interpretation None       Radiology No results found.  Procedures Procedures (including  critical care time)  Medications Ordered in ED Medications - No data to display   Initial Impression / Assessment and Plan / ED Course  I have reviewed the triage vital signs and the nursing notes.  Pertinent labs & imaging results that were available during my care of the patient were reviewed by me and considered in my medical decision making (see chart for details).  Clinical Course    35 y.o. male brought in by [facility] after IVC paperwork was taken out stating she believes he's hallucinating. Pt very calm and cooperative here, not responding to internal stimuli, no obvious hallucinations, denies AVH/HI/SI. Very pleasant. Denies other complaints and denies illicit drug use and alcohol use. +Smoker. Will discuss with his mother to see if I can get any collateral info, but I feel this IVC paperwork should likely be rescinded based on my impression of this calm cooperative pt. Will hold off on labs and TTS consult until I've spoken to his mother.   3:29 PM Collateral info from mother essentially the same as what was put on IVC paperwork, she acknowledges that he hasn't been making any threats regarding SI/HI, and has not been aggressive with anyone. States she feels the hallucinations have been ongoing x1 month and she wants him to "get help so he can get right". Discussed case with Dr. Jeraldine Loots, he will see the pt, but we feel it's likely that we'll rescind the IVC order. Would like TTS to weigh in on his care to make sure we are all in agreement. Toyka from TTS will come by to evaluate the pt, I discussed his case with her and she will see him shortly. Will hold off on labs given that it's likely he will not need them. Will reassess shortly.   3:59 PM Toyka saw and evaluated the pt, agrees with rescinding IVC paperwork. Dr. Jeraldine Loots also agrees. Will give resources for psychiatric care. F/up with PCP in 1wk for refills of medications and ongoing management of his health. Smoking cessation  encouraged. I explained the diagnosis and have given explicit precautions to return to the ER including for any other new or worsening symptoms. The patient understands and accepts the medical plan as it's been dictated and I have answered their questions. Discharge instructions concerning home care and prescriptions have been given. The patient is STABLE and is discharged to home in good condition.   Final Clinical Impressions(s) / ED Diagnoses   Final diagnoses:  Involuntary commitment  Tobacco use    New Prescriptions  New Prescriptions   No medications on file     Allen Derry, PA-C 01/01/16 1559    Gerhard Munch, MD 01/02/16 1132

## 2016-01-01 NOTE — ED Triage Notes (Signed)
Pt brought in by [facility] under IVC. Pt IVC'd by his mother. Per paperwork: "petitioner states that she believes respondent is hallucinating and/or hearing voices as evidenced by his talking out loud and making hand gestures as if he is holding a conversation with someone; becomes hostile when petitioner tries to talk to his about his problems."  Police state pt has been cooperative, has not endorsed SI/HI.

## 2016-01-01 NOTE — Discharge Instructions (Signed)
You were evaluated today by behavioral health and the emergency room staff, and we feel that you are safe to go home. Please follow up with your regular doctor in 1 week for ongoing management of your medical conditions and refills of your medications. Please use the list below to find behavioral health centers to help with any of your psychiatric needs. Return to the ER for changes or worsening symptoms. STOP SMOKING!

## 2016-01-01 NOTE — ED Notes (Signed)
Bed: [facility] Expected date:  Expected time:  Means of arrival:  Comments: IVC 

## 2016-01-01 NOTE — BH Assessment (Addendum)
Assessment Note  Brady Gibbs is an 35 y.o. male. He presents to [facility] with [employer] and IVC papers taken out by his mother. Patient's mother is Janyce Llanos) [phone]. The IVC reads: "A Danger to self and others, To Wit: Petitioner states that she believes respondent is hallucinating and/or hearing voices as evidenced by this talking out loud and making hand gestures as if he is holding a conversation with someone; becomes hostile when petitioner tries to talk to him about his problems". Writer met with patient face to face. He denies SI. Denies a history of self injurious behaviors. Patient admits to mild symptoms of depression such as loss of interest in usual pleasures. Family history of depression (mother). Patient reports ongoing issues with anxiety. Sts he wakes up most morning with panic attacks. Onset of anxiety symptoms started "yrs ago".No history of HI. No legal issues. Denies AVH's. Patient does not appear to be responding to internal stimuli. No history of psychiatric treatment and/or hospitalizations.   Diagnosis: Depressive Disorder and Anxiety Disorder  Past Medical History:  Past Medical History:  Diagnosis Date  . Diabetes mellitus without complication ([place])   . Sleep apnea     Past Surgical History:  Procedure Laterality Date  . FOOT SURGERY Right  at age 17   cyst removal  . HERNIA REPAIR     umbical hernia repair at 89 or 35 years old.    Family History: No family history on file.  Social History:  reports that he has been smoking Cigarettes.  He has been smoking about 0.50 packs per day. He has never used smokeless tobacco. He reports that he does not drink alcohol or use drugs.  Additional Social History:     CIWA: CIWA-Ar BP: 121/85 Pulse Rate: 101 COWS:    Allergies: No Known Allergies  Home Medications:  (Not in a hospital admission)  OB/GYN Status:  No LMP for male patient.  General Assessment Data Location of Assessment: [facility] TTS Assessment: In  system Is this a Tele or Face-to-Face Assessment?: Face-to-Face Is this an Initial Assessment or a Re-assessment for this encounter?: Initial Assessment Marital status: Single Maiden name:  (n/a) Is patient pregnant?: No Pregnancy Status: No Living Arrangements: Parent, Other (Comment) (lives with mother/Jerita Winona Legato [phone]) Can pt return to current living arrangement?: No (lives with mom; patient does not plan to return home) Admission Status: Involuntary Is patient capable of signing voluntary admission?: Yes Referral Source: Self/Family/Friend Insurance type:  (Self Pay )     Crisis Care Plan Living Arrangements: Parent, Other (Comment) (lives with mother/Jerita Winona Legato [phone]) Legal Guardian: Other: (no legal guardian ) Name of Psychiatrist:  (No psychiatrist ) Name of Therapist:  (No therapist)  Education Status Is patient currently in school?: No Current Grade:  (n/a) Highest grade of school patient has completed:  (11th grade) Name of school:  (n/a) Contact person:  (n/a)  Risk to self with the past 6 months Suicidal Ideation: No Has patient been a risk to self within the past 6 months prior to admission? : No Suicidal Intent: No Has patient had any suicidal intent within the past 6 months prior to admission? : No Is patient at risk for suicide?: No Suicidal Plan?: No Has patient had any suicidal plan within the past 6 months prior to admission? : No Access to Means: No What has been your use of drugs/alcohol within the last 12 months?:  (patient denies) Previous Attempts/Gestures: No How many times?:  (0) Other Self Harm Risks:  (  patient denies ) Triggers for Past Attempts: Other (Comment) (no prevous attempts or gestures ) Intentional Self Injurious Behavior: None Family Suicide History: No Recent stressful life event(s): Other (Comment) ("I have anxiety sometimes..due to medical issues") Persecutory voices/beliefs?: No Depression:  No Depression Symptoms:  (denies depressive symptoms ) Substance abuse history and/or treatment for substance abuse?: No Suicide prevention information given to non-admitted patients: Not applicable  Risk to Others within the past 6 months Homicidal Ideation: No Does patient have any lifetime risk of violence toward others beyond the six months prior to admission? : No Thoughts of Harm to Others: No Current Homicidal Intent: No Current Homicidal Plan: No Access to Homicidal Means: No Identified Victim:  (n/a) History of harm to others?: No Assessment of Violence: None Noted Violent Behavior Description:  (patient is currently calm and cooperative; polite) Does patient have access to weapons?: No Criminal Charges Pending?: No Does patient have a court date: No Is patient on probation?: No  Psychosis Hallucinations: None noted Delusions: None noted  Mental Status Report Appearance/Hygiene: In scrubs Eye Contact: Good Motor Activity: Freedom of movement Speech: Logical/coherent Level of Consciousness: Alert Mood: Other (Comment) (normal ) Affect: Appropriate to circumstance, Other (Comment), Flat Anxiety Level: Panic Attacks Panic attack frequency:  ("when I wake I sometimes have panic attackts") Most recent panic attack:  (today; 01/01/2016) Thought Processes: Relevant, Coherent Judgement: Unimpaired Orientation: Person, Place, Time, Situation Obsessive Compulsive Thoughts/Behaviors: None  Cognitive Functioning Concentration: Normal Memory: Recent Intact, Remote Intact IQ: Average Insight: Good Impulse Control: Good Appetite: Good Weight Loss:  ("I probably loss a few pounds.Marland KitchenMarland KitchenI'm dieting") Weight Gain:  (denies ) Sleep: Decreased Total Hours of Sleep:  ("3 or 4 hours") Vegetative Symptoms: None  ADLScreening [facility] Assessment Services) Patient's cognitive ability adequate to safely complete daily activities?: Yes Patient able to express need for assistance with  ADLs?: Yes Independently performs ADLs?: Yes (appropriate for developmental age)  Prior Inpatient Therapy Prior Inpatient Therapy: No Prior Therapy Dates:  (n/a) Prior Therapy Facilty/Provider(s):  (n/a) Reason for Treatment:  (n/a)  Prior Outpatient Therapy Prior Outpatient Therapy: No Prior Therapy Dates:  (n/a) Prior Therapy Facilty/Provider(s):  (n/a) Reason for Treatment:  (n/a) Does patient have an ACCT team?: No Does patient have Intensive In-House Services?  : No Does patient have [facility] services? : No Does patient have P4CC services?: No  ADL Screening (condition at time of admission) Patient's cognitive ability adequate to safely complete daily activities?: Yes Patient able to express need for assistance with ADLs?: Yes Independently performs ADLs?: Yes (appropriate for developmental age)             Regulatory affairs officer (For Healthcare) Does patient have an advance directive?: No Would patient like information on creating an advanced directive?: No - patient declined information    Additional Information 1:1 In Past 12 Months?: No CIRT Risk: No Elopement Risk: No Does patient have medical clearance?: Yes     Disposition:  Disposition Initial Assessment Completed for this Encounter: Yes  Discharge home with follow up referrals. No criteria for INPatient hospitalization.  On Site Evaluation by:   Reviewed with Physician:Dr. Lockwood/EDP and Mercedes Camprubi-Soms, PA-C   Waldon Merl [facility] 01/01/2016 3:41 PM

## 2016-01-02 ENCOUNTER — Emergency Department (HOSPITAL_COMMUNITY): Admission: EM | Admit: 2016-01-02 | Discharge: 2016-01-02 | Discharge status: Against Medical Advice | Payer: Self-pay

## 2016-01-02 NOTE — ED Notes (Signed)
Called unable to locate in waiting room.

## 2016-01-02 NOTE — ED Notes (Signed)
Called for pt. No answer.

## 2016-10-10 ENCOUNTER — Encounter (HOSPITAL_COMMUNITY): Payer: Self-pay

## 2016-10-10 ENCOUNTER — Inpatient Hospital Stay (HOSPITAL_COMMUNITY): Payer: Self-pay

## 2016-10-10 ENCOUNTER — Inpatient Hospital Stay (HOSPITAL_COMMUNITY)
Admission: EM | Admit: 2016-10-10 | Discharge: 2016-10-17 | DRG: 637 | Disposition: A | Discharge status: Home / Self Care | Payer: Self-pay | Attending: Internal Medicine | Admitting: Internal Medicine

## 2016-10-10 DIAGNOSIS — Z590 Homelessness: Secondary | ICD-10-CM

## 2016-10-10 DIAGNOSIS — E785 Hyperlipidemia, unspecified: Secondary | ICD-10-CM | POA: Diagnosis present

## 2016-10-10 DIAGNOSIS — Z818 Family history of other mental and behavioral disorders: Secondary | ICD-10-CM

## 2016-10-10 DIAGNOSIS — Z91128 Patient's intentional underdosing of medication regimen for other reason: Secondary | ICD-10-CM

## 2016-10-10 DIAGNOSIS — G4733 Obstructive sleep apnea (adult) (pediatric): Secondary | ICD-10-CM | POA: Diagnosis present

## 2016-10-10 DIAGNOSIS — Z9119 Patient's noncompliance with other medical treatment and regimen: Secondary | ICD-10-CM

## 2016-10-10 DIAGNOSIS — K210 Gastro-esophageal reflux disease with esophagitis: Secondary | ICD-10-CM | POA: Diagnosis present

## 2016-10-10 DIAGNOSIS — Z6832 Body mass index (BMI) 32.0-32.9, adult: Secondary | ICD-10-CM

## 2016-10-10 DIAGNOSIS — Z833 Family history of diabetes mellitus: Secondary | ICD-10-CM

## 2016-10-10 DIAGNOSIS — E875 Hyperkalemia: Secondary | ICD-10-CM | POA: Diagnosis present

## 2016-10-10 DIAGNOSIS — K449 Diaphragmatic hernia without obstruction or gangrene: Secondary | ICD-10-CM | POA: Diagnosis present

## 2016-10-10 DIAGNOSIS — F329 Major depressive disorder, single episode, unspecified: Secondary | ICD-10-CM | POA: Diagnosis present

## 2016-10-10 DIAGNOSIS — E119 Type 2 diabetes mellitus without complications: Secondary | ICD-10-CM

## 2016-10-10 DIAGNOSIS — E871 Hypo-osmolality and hyponatremia: Secondary | ICD-10-CM | POA: Diagnosis present

## 2016-10-10 DIAGNOSIS — Z794 Long term (current) use of insulin: Secondary | ICD-10-CM

## 2016-10-10 DIAGNOSIS — R1312 Dysphagia, oropharyngeal phase: Secondary | ICD-10-CM | POA: Diagnosis present

## 2016-10-10 DIAGNOSIS — G4700 Insomnia, unspecified: Secondary | ICD-10-CM | POA: Diagnosis not present

## 2016-10-10 DIAGNOSIS — E1110 Type 2 diabetes mellitus with ketoacidosis without coma: Secondary | ICD-10-CM

## 2016-10-10 DIAGNOSIS — I5189 Other ill-defined heart diseases: Secondary | ICD-10-CM

## 2016-10-10 DIAGNOSIS — E860 Dehydration: Secondary | ICD-10-CM | POA: Diagnosis present

## 2016-10-10 DIAGNOSIS — K3189 Other diseases of stomach and duodenum: Secondary | ICD-10-CM | POA: Diagnosis present

## 2016-10-10 DIAGNOSIS — I519 Heart disease, unspecified: Secondary | ICD-10-CM | POA: Diagnosis present

## 2016-10-10 DIAGNOSIS — G9341 Metabolic encephalopathy: Secondary | ICD-10-CM | POA: Diagnosis present

## 2016-10-10 DIAGNOSIS — R1310 Dysphagia, unspecified: Secondary | ICD-10-CM

## 2016-10-10 DIAGNOSIS — E870 Hyperosmolality and hypernatremia: Secondary | ICD-10-CM | POA: Diagnosis not present

## 2016-10-10 DIAGNOSIS — E861 Hypovolemia: Secondary | ICD-10-CM | POA: Diagnosis present

## 2016-10-10 DIAGNOSIS — N179 Acute kidney failure, unspecified: Secondary | ICD-10-CM | POA: Diagnosis present

## 2016-10-10 DIAGNOSIS — F419 Anxiety disorder, unspecified: Secondary | ICD-10-CM | POA: Diagnosis present

## 2016-10-10 DIAGNOSIS — K2211 Ulcer of esophagus with bleeding: Secondary | ICD-10-CM | POA: Diagnosis present

## 2016-10-10 DIAGNOSIS — R000 Tachycardia, unspecified: Secondary | ICD-10-CM | POA: Diagnosis present

## 2016-10-10 DIAGNOSIS — R933 Abnormal findings on diagnostic imaging of other parts of digestive tract: Secondary | ICD-10-CM

## 2016-10-10 DIAGNOSIS — E669 Obesity, unspecified: Secondary | ICD-10-CM | POA: Diagnosis present

## 2016-10-10 DIAGNOSIS — T383X6A Underdosing of insulin and oral hypoglycemic [antidiabetic] drugs, initial encounter: Secondary | ICD-10-CM | POA: Diagnosis present

## 2016-10-10 DIAGNOSIS — D72829 Elevated white blood cell count, unspecified: Secondary | ICD-10-CM | POA: Diagnosis present

## 2016-10-10 DIAGNOSIS — F17210 Nicotine dependence, cigarettes, uncomplicated: Secondary | ICD-10-CM | POA: Diagnosis present

## 2016-10-10 HISTORY — DX: Type 2 diabetes mellitus with ketoacidosis without coma: E11.10

## 2016-10-10 LAB — GLUCOSE, CAPILLARY
GLUCOSE-CAPILLARY: 536 mg/dL — AB (ref 65–99)
GLUCOSE-CAPILLARY: 363 mg/dL — AB (ref 65–99)
Glucose-Capillary: 401 mg/dL — ABNORMAL HIGH (ref 65–99)

## 2016-10-10 LAB — BASIC METABOLIC PANEL
Anion gap: 32 — ABNORMAL HIGH (ref 5–15)
Anion gap: 26 — ABNORMAL HIGH (ref 5–15)
BUN: 60 mg/dL — AB (ref 6–20)
BUN: 57 mg/dL — ABNORMAL HIGH (ref 6–20)
BUN: 50 mg/dL — ABNORMAL HIGH (ref 6–20)
CHLORIDE: 86 mmol/L — AB (ref 101–111)
CHLORIDE: 102 mmol/L (ref 101–111)
CHLORIDE: 101 mmol/L (ref 101–111)
CO2: <7 mmol/L — ABNORMAL LOW (ref 22–32)
CO2: 7 mmol/L — ABNORMAL LOW (ref 22–32)
CREATININE: 3.47 mg/dL — AB (ref 0.61–1.24)
Calcium: 10.7 mg/dL — ABNORMAL HIGH (ref 8.9–10.3)
Calcium: 10.3 mg/dL (ref 8.9–10.3)
Calcium: 10.1 mg/dL (ref 8.9–10.3)
Creatinine, Ser: 3.2 mg/dL — ABNORMAL HIGH (ref 0.61–1.24)
Creatinine, Ser: 3.12 mg/dL — ABNORMAL HIGH (ref 0.61–1.24)
GFR calc Af Amer: 27 mL/min — ABNORMAL LOW (ref >60)
GFR calc non Af Amer: 21 mL/min — ABNORMAL LOW (ref >60)
GFR calc non Af Amer: 23 mL/min — ABNORMAL LOW (ref >60)
GFR calc non Af Amer: 24 mL/min — ABNORMAL LOW (ref >60)
GFR, EST AFRICAN AMERICAN: 24 mL/min — AB (ref >60)
GFR, EST AFRICAN AMERICAN: 27 mL/min — AB (ref >60)
Glucose, Bld: 1153 mg/dL (ref 65–99)
Glucose, Bld: 858 mg/dL (ref 65–99)
Glucose, Bld: 785 mg/dL (ref 65–99)
POTASSIUM: 4.7 mmol/L (ref 3.5–5.1)
Potassium: 6.7 mmol/L (ref 3.5–5.1)
Potassium: 4.8 mmol/L (ref 3.5–5.1)
SODIUM: 135 mmol/L (ref 135–145)
SODIUM: 134 mmol/L — AB (ref 135–145)
Sodium: 124 mmol/L — ABNORMAL LOW (ref 135–145)

## 2016-10-10 LAB — COMPREHENSIVE METABOLIC PANEL
ALBUMIN: 3.7 g/dL (ref 3.5–5.0)
ALT: 17 U/L (ref 17–63)
AST: 19 U/L (ref 15–41)
Alkaline Phosphatase: 85 U/L (ref 38–126)
BUN: 56 mg/dL — AB (ref 6–20)
CHLORIDE: 80 mmol/L — AB (ref 101–111)
Calcium: 10.7 mg/dL — ABNORMAL HIGH (ref 8.9–10.3)
Creatinine, Ser: 3.44 mg/dL — ABNORMAL HIGH (ref 0.61–1.24)
GFR calc Af Amer: 24 mL/min — ABNORMAL LOW (ref >60)
GFR calc non Af Amer: 21 mL/min — ABNORMAL LOW (ref >60)
GLUCOSE: 1300 mg/dL — AB (ref 65–99)
POTASSIUM: 5.6 mmol/L — AB (ref 3.5–5.1)
SODIUM: 120 mmol/L — AB (ref 135–145)
Total Bilirubin: 2.3 mg/dL — ABNORMAL HIGH (ref 0.3–1.2)
Total Protein: 6.6 g/dL (ref 6.5–8.1)

## 2016-10-10 LAB — OCCULT BLOOD GASTRIC / DUODENUM (SPECIMEN CUP): Occult Blood, Gastric: POSITIVE — AB

## 2016-10-10 LAB — URINALYSIS, ROUTINE W REFLEX MICROSCOPIC
BACTERIA UA: NONE SEEN
Bilirubin Urine: NEGATIVE
Glucose, UA: >=500 mg/dL — AB
Ketones, ur: 80 mg/dL — AB
Leukocytes, UA: NEGATIVE
NITRITE: NEGATIVE
PH: 5 (ref 5.0–8.0)
Protein, ur: NEGATIVE mg/dL
RBC / HPF: NONE SEEN RBC/hpf (ref 0–5)
SPECIFIC GRAVITY, URINE: 1.021 (ref 1.005–1.030)
Squamous Epithelial / LPF: NONE SEEN

## 2016-10-10 LAB — CBG MONITORING, ED
Glucose-Capillary: >600 mg/dL (ref 65–99)
Glucose-Capillary: >600 mg/dL (ref 65–99)
Glucose-Capillary: >600 mg/dL (ref 65–99)
Glucose-Capillary: >600 mg/dL (ref 65–99)
Glucose-Capillary: 583 mg/dL (ref 65–99)

## 2016-10-10 LAB — RAPID URINE DRUG SCREEN, HOSP PERFORMED
Amphetamines: NOT DETECTED
Barbiturates: NOT DETECTED
Benzodiazepines: NOT DETECTED
COCAINE: NOT DETECTED
OPIATES: NOT DETECTED
TETRAHYDROCANNABINOL: NOT DETECTED

## 2016-10-10 LAB — BLOOD GAS, ARTERIAL
Acid-base deficit: 24.2 mmol/L — ABNORMAL HIGH (ref 0.0–2.0)
Bicarbonate: 3.8 mmol/L — ABNORMAL LOW (ref 20.0–28.0)
DRAWN BY: 35849
FIO2: 21
O2 SAT: 96.8 %
PATIENT TEMPERATURE: 98.6
pH, Arterial: 7.135 — CL (ref 7.350–7.450)
pO2, Arterial: 137 mmHg — ABNORMAL HIGH (ref 83.0–108.0)

## 2016-10-10 LAB — CBC
HEMATOCRIT: 50.6 % (ref 39.0–52.0)
HEMOGLOBIN: 14.9 g/dL (ref 13.0–17.0)
MCH: 28.1 pg (ref 26.0–34.0)
MCHC: 29.4 g/dL — AB (ref 30.0–36.0)
MCV: 95.5 fL (ref 78.0–100.0)
Platelets: 246 10*3/uL (ref 150–400)
RBC: 5.3 MIL/uL (ref 4.22–5.81)
RDW: 14.4 % (ref 11.5–15.5)
WBC: 18.6 10*3/uL — ABNORMAL HIGH (ref 4.0–10.5)

## 2016-10-10 LAB — TROPONIN I
Troponin I: 0.05 ng/mL (ref <0.03)
Troponin I: 0.03 ng/mL (ref <0.03)

## 2016-10-10 LAB — ETHANOL: Alcohol, Ethyl (B): <5 mg/dL (ref <5)

## 2016-10-10 LAB — LIPASE, BLOOD: Lipase: 49 U/L (ref 11–51)

## 2016-10-10 LAB — MRSA PCR SCREENING: MRSA by PCR: NEGATIVE

## 2016-10-10 MED ORDER — SODIUM CHLORIDE 0.9 % IV SOLN
INTRAVENOUS | Status: DC
  Administered 2016-10-10 – 2016-10-11 (×3): via INTRAVENOUS
  Administered 2016-10-11: 8.1 [IU]/h via INTRAVENOUS
  Filled 2016-10-10 (×3): qty 1

## 2016-10-10 MED ORDER — ATORVASTATIN CALCIUM 20 MG PO TABS
20.0000 mg | ORAL_TABLET | Freq: Every day | ORAL | Status: DC
  Administered 2016-10-12 – 2016-10-16 (×5): 20 mg via ORAL
  Filled 2016-10-10 (×5): qty 1

## 2016-10-10 MED ORDER — SODIUM CHLORIDE 0.9% FLUSH
3.0000 mL | Freq: Two times a day (BID) | INTRAVENOUS | Status: DC
  Administered 2016-10-12 – 2016-10-17 (×10): 3 mL via INTRAVENOUS

## 2016-10-10 MED ORDER — SODIUM CHLORIDE 0.9 % IV SOLN
INTRAVENOUS | Status: DC
  Administered 2016-10-10: 5.4 [IU]/h via INTRAVENOUS
  Filled 2016-10-10: qty 1

## 2016-10-10 MED ORDER — PANTOPRAZOLE SODIUM 40 MG PO TBEC
40.0000 mg | DELAYED_RELEASE_TABLET | Freq: Every day | ORAL | Status: DC
  Administered 2016-10-12 – 2016-10-13 (×2): 40 mg via ORAL
  Filled 2016-10-10 (×2): qty 1

## 2016-10-10 MED ORDER — DEXTROSE 50 % IV SOLN: 25.0000 mL | INTRAVENOUS | Status: DC | PRN

## 2016-10-10 MED ORDER — INSULIN REGULAR BOLUS VIA INFUSION
0.0000 [IU] | Freq: Three times a day (TID) | INTRAVENOUS | Status: DC
  Filled 2016-10-10: qty 10

## 2016-10-10 MED ORDER — DEXTROSE-NACL 5-0.45 % IV SOLN
INTRAVENOUS | Status: DC
  Administered 2016-10-11: 100 mL/h via INTRAVENOUS
  Administered 2016-10-11: 02:00:00 via INTRAVENOUS

## 2016-10-10 MED ORDER — ACETAMINOPHEN 650 MG RE SUPP: 650.0000 mg | Freq: Four times a day (QID) | RECTAL | Status: DC | PRN

## 2016-10-10 MED ORDER — SODIUM CHLORIDE 0.9 % IV BOLUS (SEPSIS)
1000.0000 mL | Freq: Once | INTRAVENOUS | Status: AC
  Administered 2016-10-10: 1000 mL via INTRAVENOUS

## 2016-10-10 MED ORDER — ACETAMINOPHEN 325 MG PO TABS: 650.0000 mg | ORAL_TABLET | Freq: Four times a day (QID) | ORAL | Status: DC | PRN

## 2016-10-10 MED ORDER — SODIUM CHLORIDE 0.9 % IV SOLN
INTRAVENOUS | Status: DC
  Administered 2016-10-11: 75 mL/h via INTRAVENOUS

## 2016-10-10 MED ORDER — SODIUM CHLORIDE 0.9 % IV BOLUS (SEPSIS)
2000.0000 mL | Freq: Once | INTRAVENOUS | Status: AC
  Administered 2016-10-10: 2000 mL via INTRAVENOUS

## 2016-10-10 NOTE — ED Notes (Signed)
Condom cath placed. Pt tolerated well.

## 2016-10-10 NOTE — H&P (Addendum)
[facility] History and Physical  Brady Gibbs WJX:[id number] DOB: 01/24/1981 DOA: 10/10/2016  Referring physician:  PCP: Patient, No Pcp Per  Specialists:   Chief Complaint: vomiting   HPI: Brady Gibbs is a 36 y.o. male with PMH of IDDM, Tobacco use, non adherence, h/o DKA presented with nausea and vomiting. He is alert, oriented but somnolent in emergency room, follows commands. D/w patient, and his mother at the bedside.  Patient states that he was not taking his insulin for several days. He developed nausea, vomiting and mild abdominal discomfort since yesterday. He denies acute chest pains, no shortness of breath, no palpitations. He denies fever, no cough, no diarrhea, no acute abdominal pains, no focal weakness or paresthesias.  -ED: patient is in DKA. abg-7.135-24.2-3.8-96. Labd: sodium -120. Clinically severe dehydration with AKI    Review of Systems: The patient denies anorexia, fever, weight loss,, vision loss, decreased hearing, hoarseness, chest pain, syncope, dyspnea on exertion, peripheral edema, balance deficits, hemoptysis, abdominal pain, melena, hematochezia, severe indigestion/heartburn, hematuria, incontinence, genital sores, muscle weakness, suspicious skin lesions, transient blindness, difficulty walking, depression, unusual weight change, abnormal bleeding, enlarged lymph nodes, angioedema, and breast masses.    Past Medical History:  Diagnosis Date  . Diabetes mellitus without complication (HCC)   . Sleep apnea    Past Surgical History:  Procedure Laterality Date  . FOOT SURGERY Right  at age 32   cyst removal  . HERNIA REPAIR     umbical hernia repair at 30 or 36 years old.   Social History:  reports that he has been smoking Cigarettes.  He has been smoking about 0.50 packs per day. He has never used smokeless tobacco. He reports that he does not drink alcohol or use drugs. Home;  where does patient live--home, ALF, SNF? and with whom if at home? Yes;  Can  patient participate in ADLs?  No Known Allergies  No family history on file.  DM (be sure to complete)  Prior to Admission medications   Medication Sig Start Date End Date Taking? Authorizing Provider  atorvastatin (LIPITOR) 20 MG tablet Take 1 tablet (20 mg total) by mouth daily at 6 PM. Patient not taking: Reported on 01/01/2016 01/03/15   Valentino Nose, MD  insulin NPH-regular Human (NOVOLIN 70/30) (70-30) 100 UNIT/ML injection Inject 30 units under the skin with food in the morning. Inject 15 units under the skin before bed. Patient not taking: Reported on 01/01/2016 01/03/15   Valentino Nose, MD  metFORMIN (GLUCOPHAGE XR) 500 MG 24 hr tablet Take 1 tablet (500 mg total) by mouth daily with breakfast. Increase to twice a day after 1 week. Patient not taking: Reported on 01/01/2016 01/03/15   Valentino Nose, MD  pantoprazole (PROTONIX) 40 MG tablet Take 1 tablet (40 mg total) by mouth daily. Patient not taking: Reported on 01/01/2016 08/09/14 08/17/15  Gust Rung, DO  podofilox (CONDYLOX) 0.5 % gel Apply topically 2 (two) times daily. Apply for 3 consecutive days followed by 4 days without treatment. Repeat as needed for up to 4 weeks. Patient not taking: Reported on 01/01/2016 08/09/14   Gust Rung, DO   Physical Exam: Vitals:   10/10/16 1500 10/10/16 1600  BP: 115/74 (!) 116/55  Pulse: (!) 116 (!) 106  Resp: (!) 35 (!) 27  Temp:       General:  Alert, oriented. Somnolent.   Eyes: eom-I, perrla   ENT: no oral ulcers   Neck: supple  Cardiovascular: s1,s2 mild tachycardia   Respiratory:  CTA BL  Abdomen: soft, nt, nd   Skin: no rash   Musculoskeletal: no leg edema   Psychiatric: no hallucinations   Neurologic: CN 2-12 intact. Motor 5/5 BL. Sensory intact   Labs on Admission:  Basic Metabolic Panel:  Recent Labs Lab 10/10/16 1325  NA 120*  K 5.6*  CL 80*  CO2 <7*  GLUCOSE 1,300*  BUN 56*  CREATININE 3.44*  CALCIUM 10.7*   Liver Function Tests:  Recent  Labs Lab 10/10/16 1325  AST 19  ALT 17  ALKPHOS 85  BILITOT 2.3*  PROT 6.6  ALBUMIN 3.7    Recent Labs Lab 10/10/16 1325  LIPASE 49   No results for input(s): AMMONIA in the last 168 hours. CBC:  Recent Labs Lab 10/10/16 1325  WBC 18.6*  HGB 14.9  HCT 50.6  MCV 95.5  PLT 246   Cardiac Enzymes:  Recent Labs Lab 10/10/16 1155  TROPONINI 0.05*    BNP (last 3 results) No results for input(s): BNP in the last 8760 hours.  ProBNP (last 3 results) No results for input(s): PROBNP in the last 8760 hours.  CBG:  Recent Labs Lab 10/10/16 1147 10/10/16 1226 10/10/16 1433 10/10/16 1552  GLUCAP >600* >600* >600* >600*    Radiological Exams on Admission: No results found.  EKG: Independently reviewed.   Assessment/Plan Active Problems:   DKA (diabetic ketoacidoses) (HCC)   36 y.o. male with PMH of IDDM, Tobacco use, non adherence, h/o DKA presented with nausea and vomiting. Admitted with DKA  DKA. Non adherence to insulin at home. Severe dehydration, electrolyte abnormalities. Ph-7.15-24-3.8-96 -cont iv ns, fluid bolus ed in ED. Cont iv insulin drip per protocol. monitor closely, serial labs, adjust as needed. Check ha1c  AKI due to dehydration. Cr is 3.4 on admission. Hyponatremia, hyperkalemia. Hypovolemia, tachycardic. Cont iv fluid resuscitation, monitor labs closely. Monitor urine output. I/o. D/c metformin due renal issues   Somnolence. Likely metabolic due to DKA. Neuro exam is non focal. Cont as above,. Monitor neuro checks, f/u CT   Trop-0.05. No acute chest pains. Ecg: diffuse st changes vs LVH. Will check serial trop. Echo. Consulted cardiology eval  Leukocytosis, questionable stress related. Afebrile, no s/s of systemic infection. No s/s of UTI. No respiratory symptoms. check CXR  Addendum: d/w Dr. Ladona Ridgelaylor who reviewed ecg/trop-0.05 and thought that ecg changes could be from LVH. recommended to cont to treat DKA. Will trend and inform if cont to  be elevated    Monitor/admit to step down. Will monitor closely, will consult PCCM if needed  None.  if consultant consulted, please document name and whether formally or informally consulted  Code Status: full.  (must indicate code status--if unknown or must be presumed, indicate so) Family Communication: d/w patient, his mother (indicate person spoken with, if applicable, with phone number if by telephone) Disposition Plan: home when ready (indicate anticipated LOS)  Time spent: >45 minutes   Esperanza Gibbs, Brady Ventress N [facility] Pager [phone]  If 7PM-7AM, please contact night-coverage [email or web address] Password [email or web address] 10/10/2016, 4:28 PM

## 2016-10-10 NOTE — ED Provider Notes (Addendum)
[facility]-EMERGENCY DEPT Provider Note   CSN: [id number] Arrival date & time: 10/10/16  1111     History   Chief Complaint Chief Complaint  Patient presents with  . Emesis    HPI Brady Gibbs is a 36 y.o. male.  HPI  36 year old male with non-insulin-dependent diabetes presents today after not taking any of his medications for several days with increased nausea and vomiting. He is also somewhat confused. His mother is the main historian. She states that he has not been well for the past week. He complains of increased urination and thirst. She states that he has been vomiting all night last night and has been having increased sleepiness. She reports that he does not like to take any of his medications.  Past Medical History:  Diagnosis Date  . Diabetes mellitus without complication (HCC)   . Sleep apnea     Patient Active Problem List   Diagnosis Date Noted  . DKA (diabetic ketoacidoses) (HCC) 01/01/2015  . Candidiasis   . Diabetic ketoacidosis without coma associated with type 2 diabetes mellitus (HCC)   . Metabolic acidosis   . Insomnia 08/10/2014  . Onychomycosis of right great toe 08/10/2014  . Body mass index (BMI) of 40.0-44.9 in adult (HCC) 08/10/2014  . Hyperlipidemia 09/28/2012  . Condyloma acuminata 09/27/2012  . Severe obstructive sleep apnea 06/15/2012  . Diabetes mellitus, type 2 (HCC) 04/15/2012    Past Surgical History:  Procedure Laterality Date  . FOOT SURGERY Right  at age 55   cyst removal  . HERNIA REPAIR     umbical hernia repair at 73 or 36 years old.       Home Medications    Prior to Admission medications   Medication Sig Start Date End Date Taking? Authorizing Provider  atorvastatin (LIPITOR) 20 MG tablet Take 1 tablet (20 mg total) by mouth daily at 6 PM. Patient not taking: Reported on 01/01/2016 01/03/15   Valentino Nose, MD  insulin NPH-regular Human (NOVOLIN 70/30) (70-30) 100 UNIT/ML injection Inject 30 units under the skin with food in  the morning. Inject 15 units under the skin before bed. Patient not taking: Reported on 01/01/2016 01/03/15   Valentino Nose, MD  metFORMIN (GLUCOPHAGE XR) 500 MG 24 hr tablet Take 1 tablet (500 mg total) by mouth daily with breakfast. Increase to twice a day after 1 week. Patient not taking: Reported on 01/01/2016 01/03/15   Valentino Nose, MD  pantoprazole (PROTONIX) 40 MG tablet Take 1 tablet (40 mg total) by mouth daily. Patient not taking: Reported on 01/01/2016 08/09/14 08/17/15  Gust Rung, DO  podofilox (CONDYLOX) 0.5 % gel Apply topically 2 (two) times daily. Apply for 3 consecutive days followed by 4 days without treatment. Repeat as needed for up to 4 weeks. Patient not taking: Reported on 01/01/2016 08/09/14   Gust Rung, DO    Family History No family history on file.  Social History Social History  Substance Use Topics  . Smoking status: Current Every Day Smoker    Packs/day: 0.50    Types: Cigarettes  . Smokeless tobacco: Never Used  . Alcohol use No     Allergies   Patient has no known allergies.   Review of Systems Review of Systems  All other systems reviewed and are negative.    Physical Exam Updated Vital Signs BP 121/77 (BP Location: Left Arm)   Pulse (!) 116   Temp 97.5 F (36.4 C) (Oral)   Resp (!) 28   SpO2  100%   Physical Exam  Constitutional: He appears well-developed and well-nourished. No distress.  HENT:  Head: Normocephalic and atraumatic.  Right Ear: External ear normal.  Left Ear: External ear normal.  Nose: Nose normal.  Eyes: EOM are normal. Pupils are equal, round, and reactive to light.  Neck: Normal range of motion. Neck supple.  Cardiovascular: Normal rate.   Pulmonary/Chest: Effort normal and breath sounds normal.  Abdominal: Soft. Bowel sounds are normal.  Musculoskeletal: Normal range of motion.  Neurological: He is alert. He displays normal reflexes. No cranial nerve deficit. He exhibits normal muscle tone. Coordination  normal.  Patient speech is somewhat difficult to understand  Skin: Skin is warm. Capillary refill takes less than 2 seconds.  Nursing note and vitals reviewed.    ED Treatments / Results  Labs (all labs ordered are listed, but only abnormal results are displayed) Labs Reviewed  BLOOD GAS, ARTERIAL - Abnormal; Notable for the following:       Result Value   pH, Arterial 7.135 (*)    pO2, Arterial 137.0 (*)    Bicarbonate 3.8 (*)    Acid-base deficit 24.2 (*)    All other components within normal limits  CBG MONITORING, ED - Abnormal; Notable for the following:    Glucose-Capillary >600 (*)    All other components within normal limits  CBG MONITORING, ED - Abnormal; Notable for the following:    Glucose-Capillary >600 (*)    All other components within normal limits  ETHANOL  CBC  URINALYSIS, ROUTINE W REFLEX MICROSCOPIC  RAPID URINE DRUG SCREEN, HOSP PERFORMED  TROPONIN I  COMPREHENSIVE METABOLIC PANEL  LIPASE, BLOOD  CBG MONITORING, ED  I-STAT ARTERIAL BLOOD GAS, ED    EKG  EKG Interpretation  Date/Time:  Saturday October 10 2016 12:24:44 EDT Ventricular Rate:  119 PR Interval:    QRS Duration: 90 QT Interval:  325 QTC Calculation: 458 R Axis:   102 Text Interpretation:  Sinus tachycardia Consider left ventricular hypertrophy ST elev, probable normal early repol pattern Confirmed by Margarita Grizzleay, Makenzee Choudhry [phone]([id number]) on 10/10/2016 12:29:01 PM       Radiology No results found.  Procedures Procedures (including critical care time)  Medications Ordered in ED Medications  insulin regular (NOVOLIN R,HUMULIN R) 100 Units in sodium chloride 0.9 % 100 mL (1 Units/mL) infusion (not administered)  sodium chloride 0.9 % bolus 1,000 mL (1,000 mLs Intravenous New Bag/Given 10/10/16 1216)     Initial Impression / Assessment and Plan / ED Course  I have reviewed the triage vital signs and the nursing notes.  Pertinent labs & imaging results that were available during my care of the  patient were reviewed by me and considered in my medical decision making (see chart for details). 2:07 PM Called labs find out why labs not resulted. They report that the chemistry was abnormal and their protocol is to redraw it. They have not received the redraw blood. They state that the glucose will need to be diluted as it was tested out as greater than 1200 with sodium at 120 and potassium at 5.6. Patient has had IV fluids and insulin is ordered. However, nursing is waiting for insulin drip to arrive. Patient with pH of 7.13 consistent with DKA.   2:58 PM Discussed with Dr. Marylouise StacksPanchal, on call for critical, advises further treatment in ED to assess for improvement and possible admission to step down. D.w. Dr. York SpanielBuriev and will admit to step down 1- dka- iv fluids and insulin ensuing with  blood sugar and electrolyte checks 2-ams- likely secondary to #1 3- corrected serum sodium at 140 4-renal failure with aki creatinine at 3.44 previously 1.23  5- elevated troponin-ekg with diffuse st changes- patient without complaints of chest pain and symptoms present for several days.  Doubt stemi, but plan cardiology consult.Discussed with Dr. Ladona Ridgel and reviewed ekg and troponin and agrees that ekg c.w. lvh and in setting of dka elevated troponin c.w. Physiologic stress- he will place formal consult if admitting service requests.  D.w. Dr. York Spaniel   CRITICAL CARE Performed by: Hilario Quarry Total critical care time: 45 minutes Critical care time was exclusive of separately billable procedures and treating other patients. Critical care was necessary to treat or prevent imminent or life-threatening deterioration. Critical care was time spent personally by me on the following activities: development of treatment plan with patient and/or surrogate as well as nursing, discussions with consultants, evaluation of patient's response to treatment, examination of patient, obtaining history from patient or surrogate,  ordering and performing treatments and interventions, ordering and review of laboratory studies, ordering and review of radiographic studies, pulse oximetry and re-evaluation of patient's condition.  Final Clinical Impressions(s) / ED Diagnoses   Final diagnoses:  Diabetic ketoacidosis without coma associated with type 2 diabetes mellitus (HCC)    New Prescriptions New Prescriptions   No medications on file     Margarita Grizzle, MD 10/10/16 1511    Margarita Grizzle, MD 10/10/16 1534

## 2016-10-10 NOTE — ED Notes (Signed)
CBG above 601 MD at bedside

## 2016-10-10 NOTE — ED Notes (Signed)
Mother: [phone]

## 2016-10-10 NOTE — ED Notes (Signed)
CBG above 601

## 2016-10-10 NOTE — ED Notes (Signed)
Attempted report pt bed not approved, on hold waiting for plan for patient

## 2016-10-10 NOTE — ED Notes (Signed)
CBG: 601

## 2016-10-10 NOTE — ED Triage Notes (Signed)
Pt arrives to ED with c/o nausea. He appears drowsy and is slow to respond with a soft voice. He is alert and oriented and follows commands. He sates he has not vomited but feels sick. Hx of Diabetes. CBG at triage read "high."

## 2016-10-10 NOTE — ED Notes (Addendum)
Critical lab value: lab value read back and physician alerted  Troponin 0.05

## 2016-10-10 NOTE — ED Notes (Signed)
CBG: over 601

## 2016-10-11 ENCOUNTER — Encounter (HOSPITAL_COMMUNITY): Payer: Self-pay | Admitting: *Deleted

## 2016-10-11 DIAGNOSIS — G4733 Obstructive sleep apnea (adult) (pediatric): Secondary | ICD-10-CM

## 2016-10-11 LAB — BASIC METABOLIC PANEL
Anion gap: 18 — ABNORMAL HIGH (ref 5–15)
Anion gap: 9 (ref 5–15)
Anion gap: 15 (ref 5–15)
BUN: 49 mg/dL — ABNORMAL HIGH (ref 6–20)
BUN: 35 mg/dL — AB (ref 6–20)
BUN: 30 mg/dL — ABNORMAL HIGH (ref 6–20)
CALCIUM: 10.3 mg/dL (ref 8.9–10.3)
CHLORIDE: 115 mmol/L — AB (ref 101–111)
CO2: 9 mmol/L — ABNORMAL LOW (ref 22–32)
CO2: 19 mmol/L — ABNORMAL LOW (ref 22–32)
CO2: 16 mmol/L — ABNORMAL LOW (ref 22–32)
Calcium: 9.8 mg/dL (ref 8.9–10.3)
Calcium: 9.3 mg/dL (ref 8.9–10.3)
Chloride: 117 mmol/L — ABNORMAL HIGH (ref 101–111)
Chloride: 121 mmol/L — ABNORMAL HIGH (ref 101–111)
Creatinine, Ser: 2.34 mg/dL — ABNORMAL HIGH (ref 0.61–1.24)
Creatinine, Ser: 1.69 mg/dL — ABNORMAL HIGH (ref 0.61–1.24)
Creatinine, Ser: 1.7 mg/dL — ABNORMAL HIGH (ref 0.61–1.24)
GFR calc Af Amer: 39 mL/min — ABNORMAL LOW (ref >60)
GFR calc Af Amer: 58 mL/min — ABNORMAL LOW (ref >60)
GFR, EST AFRICAN AMERICAN: 57 mL/min — AB (ref >60)
GFR, EST NON AFRICAN AMERICAN: 34 mL/min — AB (ref >60)
GFR, EST NON AFRICAN AMERICAN: 50 mL/min — AB (ref >60)
GFR, EST NON AFRICAN AMERICAN: 49 mL/min — AB (ref >60)
GLUCOSE: 360 mg/dL — AB (ref 65–99)
GLUCOSE: 149 mg/dL — AB (ref 65–99)
Glucose, Bld: 342 mg/dL — ABNORMAL HIGH (ref 65–99)
POTASSIUM: 4.3 mmol/L (ref 3.5–5.1)
POTASSIUM: 4.2 mmol/L (ref 3.5–5.1)
POTASSIUM: 4.5 mmol/L (ref 3.5–5.1)
SODIUM: 146 mmol/L — AB (ref 135–145)
Sodium: 144 mmol/L (ref 135–145)
Sodium: 149 mmol/L — ABNORMAL HIGH (ref 135–145)

## 2016-10-11 LAB — GLUCOSE, CAPILLARY
GLUCOSE-CAPILLARY: 219 mg/dL — AB (ref 65–99)
GLUCOSE-CAPILLARY: 237 mg/dL — AB (ref 65–99)
GLUCOSE-CAPILLARY: 237 mg/dL — AB (ref 65–99)
GLUCOSE-CAPILLARY: 274 mg/dL — AB (ref 65–99)
GLUCOSE-CAPILLARY: 290 mg/dL — AB (ref 65–99)
GLUCOSE-CAPILLARY: 320 mg/dL — AB (ref 65–99)
Glucose-Capillary: 194 mg/dL — ABNORMAL HIGH (ref 65–99)
Glucose-Capillary: 205 mg/dL — ABNORMAL HIGH (ref 65–99)
Glucose-Capillary: 158 mg/dL — ABNORMAL HIGH (ref 65–99)
Glucose-Capillary: 138 mg/dL — ABNORMAL HIGH (ref 65–99)
Glucose-Capillary: 157 mg/dL — ABNORMAL HIGH (ref 65–99)
Glucose-Capillary: 155 mg/dL — ABNORMAL HIGH (ref 65–99)
Glucose-Capillary: 410 mg/dL — ABNORMAL HIGH (ref 65–99)

## 2016-10-11 LAB — CBC
HEMATOCRIT: 44.5 % (ref 39.0–52.0)
HEMOGLOBIN: 14.5 g/dL (ref 13.0–17.0)
MCH: 27.5 pg (ref 26.0–34.0)
MCHC: 32.6 g/dL (ref 30.0–36.0)
MCV: 84.4 fL (ref 78.0–100.0)
PLATELETS: 240 10*3/uL (ref 150–400)
RBC: 5.27 MIL/uL (ref 4.22–5.81)
RDW: 13.8 % (ref 11.5–15.5)
WBC: 13.8 10*3/uL — ABNORMAL HIGH (ref 4.0–10.5)

## 2016-10-11 LAB — TROPONIN I
Troponin I: 0.03 ng/mL (ref <0.03)
Troponin I: 0.03 ng/mL (ref <0.03)

## 2016-10-11 LAB — PHOSPHORUS: PHOSPHORUS: 2 mg/dL — AB (ref 2.5–4.6)

## 2016-10-11 LAB — HEMOGLOBIN A1C: Hgb A1c MFr Bld: >15.5 % — ABNORMAL HIGH (ref 4.8–5.6)

## 2016-10-11 LAB — HIV ANTIBODY (ROUTINE TESTING W REFLEX): HIV SCREEN 4TH GENERATION: NONREACTIVE

## 2016-10-11 MED ORDER — INSULIN ASPART 100 UNIT/ML ~~LOC~~ SOLN
0.0000 [IU] | Freq: Three times a day (TID) | SUBCUTANEOUS | Status: DC
  Administered 2016-10-11: 11 [IU] via SUBCUTANEOUS
  Administered 2016-10-11: 5 [IU] via SUBCUTANEOUS

## 2016-10-11 MED ORDER — POTASSIUM CHLORIDE 10 MEQ/100ML IV SOLN: 10.0000 meq | INTRAVENOUS | Status: DC

## 2016-10-11 MED ORDER — SODIUM CHLORIDE 0.9 % IV SOLN: INTRAVENOUS | Status: AC

## 2016-10-11 MED ORDER — SODIUM CHLORIDE 0.9 % IV SOLN
INTRAVENOUS | Status: DC
  Administered 2016-10-11: 1 [IU]/h via INTRAVENOUS
  Filled 2016-10-11 (×2): qty 1

## 2016-10-11 MED ORDER — SODIUM CHLORIDE 0.9 % IV SOLN
INTRAVENOUS | Status: DC
  Administered 2016-10-11: 150 mL/h via INTRAVENOUS
  Administered 2016-10-12: 03:00:00 via INTRAVENOUS

## 2016-10-11 MED ORDER — POTASSIUM PHOSPHATES 15 MMOLE/5ML IV SOLN
30.0000 mmol | Freq: Once | INTRAVENOUS | Status: AC
  Administered 2016-10-12: 30 mmol via INTRAVENOUS
  Filled 2016-10-11: qty 10

## 2016-10-11 MED ORDER — HEPARIN SODIUM (PORCINE) 5000 UNIT/ML IJ SOLN
5000.0000 [IU] | Freq: Three times a day (TID) | INTRAMUSCULAR | Status: DC
  Administered 2016-10-11 – 2016-10-14 (×7): 5000 [IU] via SUBCUTANEOUS
  Filled 2016-10-11 (×7): qty 1

## 2016-10-11 MED ORDER — DEXTROSE-NACL 5-0.45 % IV SOLN
INTRAVENOUS | Status: DC
  Administered 2016-10-12: 125 mL/h via INTRAVENOUS

## 2016-10-11 MED ORDER — INSULIN ASPART 100 UNIT/ML ~~LOC~~ SOLN
10.0000 [IU] | Freq: Once | SUBCUTANEOUS | Status: AC
  Administered 2016-10-11: 10 [IU] via SUBCUTANEOUS

## 2016-10-11 MED ORDER — SODIUM CHLORIDE 0.9 % IV BOLUS (SEPSIS)
1000.0000 mL | Freq: Once | INTRAVENOUS | Status: AC
  Administered 2016-10-11: 1000 mL via INTRAVENOUS

## 2016-10-11 MED ORDER — INSULIN ASPART 100 UNIT/ML ~~LOC~~ SOLN: 0.0000 [IU] | Freq: Every day | SUBCUTANEOUS | Status: DC

## 2016-10-11 MED ORDER — SODIUM CHLORIDE 0.45 % IV SOLN
INTRAVENOUS | Status: DC
  Administered 2016-10-11: 75 mL/h via INTRAVENOUS
  Administered 2016-10-12 – 2016-10-13 (×2): via INTRAVENOUS

## 2016-10-11 MED ORDER — INSULIN GLARGINE 100 UNIT/ML ~~LOC~~ SOLN
25.0000 [IU] | Freq: Every day | SUBCUTANEOUS | Status: DC
  Administered 2016-10-11: 25 [IU] via SUBCUTANEOUS
  Filled 2016-10-11: qty 0.25

## 2016-10-11 NOTE — Progress Notes (Signed)
New order received to restart Insulin gtt. Pharmacy notified and received new bag of Insulin at 2320. Insulin started at glucose check of 305. See flowsheet for remaining adjustments. Patient NPO as ordered. BMP's ordered Q 4hrs. K+ and Phosp replaced.  Monitoring closely.

## 2016-10-11 NOTE — Progress Notes (Signed)
Order for CPAP QHS. Pt set up on auto CPAP 20cmH20(max), 7cmH20(min) with FFM, RA. Pt tolerating current settings at this time. RT will continue to monitor.

## 2016-10-11 NOTE — Progress Notes (Signed)
PROGRESS NOTE    Brady Gibbs  QMV:[id number] DOB: 1980/07/19 DOA: 10/10/2016 PCP: Patient, No Pcp Per  Brief Narrative: 36/M wirg IDDM, Tobacco use, non compliance admitted with DKA, stopped taking Insulin few days ago, admitted with DKA, AKI, severe dehydration last pm   Assessment & Plan:   Principal Problem: DKA (diabetic ketoacidoses) (HCC) -due to poor compliance -Insulin gap corrected -start Lantus and SSI, start diet  AKi -due to dehydration -craetinine 3.2 on admission, improving to 1.6 -baseline around 1.2 -continue IVF today  Elevated troponin/-mild/flat trend -due to demand from DKA/AKi  -no evidence of ACS  Hyponatremia -due to dehydration and hyperglycemia -improved, now Na high, change IVF to 1/2 NS  Metabolic encephalopathy -due to DKA/AKI/electrolyte derangements and OSA -will order CPAP QHS  Obstructive sleep apnea -non compliant with CPAP -will order QHS  DVT prophylaxis: add hep SQ Code Status: Full Code Family Communication: None at bedside Disposition Plan: Home in few days   Subjective: Feels better, takes Insulin PRN and not regularly per Pt  Objective: Vitals:   10/10/16 2020 10/11/16 0017 10/11/16 0402 10/11/16 0810  BP: 121/75 113/63 126/68 103/69  Pulse: (!) 111 (!) 114 (!) 108 79  Resp: 19 (!) 22 (!) 25 19  Temp: 97.6 F (36.4 C) 98.4 F (36.9 C) 98.6 F (37 C) 98.3 F (36.8 C)  TempSrc: Oral Oral Oral Oral  SpO2: (S) 99% 100% 98% 99%  Weight: 127 kg (279 lb 15.8 oz)     Height: 6\' 1"  (1.854 m)       Intake/Output Summary (Last 24 hours) at 10/11/16 1124 Last data filed at 10/11/16 0608  Gross per 24 hour  Intake           2427.8 ml  Output              700 ml  Net           1727.8 ml   Filed Weights   10/10/16 2020  Weight: 127 kg (279 lb 15.8 oz)    Examination:  General exam: Somnolent, arousable, well built male, resting in bed Respiratory system: Clear to auscultation. Respiratory effort  normal. Cardiovascular system: S1 & S2 heard, RRR. No JVD Gastrointestinal system: Abdomen is nondistended, soft and nontender. Bs present Central nervous system:Somnolent, arouses, answers all questions, moves all extremities, No focal neurological deficits. Extremities: Symmetric 5 x 5 power. Skin: No rashes, lesions or ulcers Psychiatry: flat affect    Data Reviewed:   CBC:  Recent Labs Lab 10/10/16 1325 10/11/16 0521  WBC 18.6* 13.8*  HGB 14.9 14.5  HCT 50.6 44.5  MCV 95.5 84.4  PLT 246 240   Basic Metabolic Panel:  Recent Labs Lab 10/10/16 1537 10/10/16 1813 10/10/16 1842 10/10/16 2329 10/11/16 0521  NA 124* 135 134* 144 149*  K 6.7* 4.8 4.7 4.3 4.2  CL 86* 102 101 117* 121*  CO2 <7* <7* 7* 9* 19*  GLUCOSE 1,153* 858* 785* 360* 149*  BUN 60* 57* 50* 49* 35*  CREATININE 3.47* 3.20* 3.12* 2.34* 1.69*  CALCIUM 10.7* 10.3 10.1 10.3 9.8   GFR: Estimated Creatinine Clearance: 82.7 mL/min (A) (by C-G formula based on SCr of 1.69 mg/dL (H)). Liver Function Tests:  Recent Labs Lab 10/10/16 1325  AST 19  ALT 17  ALKPHOS 85  BILITOT 2.3*  PROT 6.6  ALBUMIN 3.7    Recent Labs Lab 10/10/16 1325  LIPASE 49   No results for input(s): AMMONIA in the last 168 hours. Coagulation  Profile: No results for input(s): INR, PROTIME in the last 168 hours. Cardiac Enzymes:  Recent Labs Lab 10/10/16 1155 10/10/16 1842 10/10/16 2329 10/11/16 0521  TROPONINI 0.05* 0.03* 0.03* 0.03*   BNP (last 3 results) No results for input(s): PROBNP in the last 8760 hours. HbA1C: No results for input(s): HGBA1C in the last 72 hours. CBG:  Recent Labs Lab 10/11/16 0508 10/11/16 0607 10/11/16 0659 10/11/16 0854 10/11/16 1008  GLUCAP 205* 158* 138* 157* 155*   Lipid Profile: No results for input(s): CHOL, HDL, LDLCALC, TRIG, CHOLHDL, LDLDIRECT in the last 72 hours. Thyroid Function Tests: No results for input(s): TSH, T4TOTAL, FREET4, T3FREE, THYROIDAB in the last  72 hours. Anemia Panel: No results for input(s): VITAMINB12, FOLATE, FERRITIN, TIBC, IRON, RETICCTPCT in the last 72 hours. Urine analysis:    Component Value Date/Time   COLORURINE STRAW (A) 10/10/2016 1435   APPEARANCEUR CLEAR 10/10/2016 1435   LABSPEC 1.021 10/10/2016 1435   PHURINE 5.0 10/10/2016 1435   GLUCOSEU >=500 (A) 10/10/2016 1435   HGBUR SMALL (A) 10/10/2016 1435   BILIRUBINUR NEGATIVE 10/10/2016 1435   KETONESUR 80 (A) 10/10/2016 1435   PROTEINUR NEGATIVE 10/10/2016 1435   UROBILINOGEN 0.2 01/01/2015 1445   NITRITE NEGATIVE 10/10/2016 1435   LEUKOCYTESUR NEGATIVE 10/10/2016 1435   Sepsis Labs: @LABRCNTIP (procalcitonin:4,lacticidven:4)  ) Recent Results (from the past 240 hour(s))  MRSA PCR Screening     Status: None   Collection Time: 10/10/16  8:23 PM  Result Value Ref Range Status   MRSA by PCR NEGATIVE NEGATIVE Final    Comment:        The GeneXpert MRSA Assay (FDA approved for NASAL specimens only), is one component of a comprehensive MRSA colonization surveillance program. It is not intended to diagnose MRSA infection nor to guide or monitor treatment for MRSA infections.          Radiology Studies: Ct Head Wo Contrast  Result Date: 10/10/2016 CLINICAL DATA:  Nausea vomiting EXAM: CT HEAD WITHOUT CONTRAST TECHNIQUE: Contiguous axial images were obtained from the base of the skull through the vertex without intravenous contrast. COMPARISON:  02/03/2010 FINDINGS: Brain: No evidence of acute infarction, hemorrhage, hydrocephalus, extra-axial collection or mass lesion/mass effect. Vascular: No hyperdense vessels.  No unexpected calcification. Skull: No fracture or suspicious bone lesion Sinuses/Orbits: Mucosal thickening in the sphenoid sinus. Mucous retention cyst or lobulated mucosal thickening in the right maxillary sinus. Mild ethmoid disease. No acute orbital abnormality Other: None IMPRESSION: No CT evidence for acute intracranial abnormality.  Electronically Signed   By: Jasmine PangKim  Fujinaga M.D.   On: 10/10/2016 18:29   Dg Chest Port 1 View  Result Date: 10/10/2016 CLINICAL DATA:  36 y/o  M; leukocytosis. EXAM: PORTABLE CHEST 1 VIEW COMPARISON:  03/21/2012 chest radiograph. FINDINGS: Stable normal cardiac silhouette given projection and technique. Low lung volumes accentuate pulmonary markings. No focal consolidation or effusion identified. Bones are unremarkable. IMPRESSION: Low lung volumes.  No acute pulmonary process identified Electronically Signed   By: Mitzi HansenLance  Furusawa-Stratton M.D.   On: 10/10/2016 17:39        Scheduled Meds: . atorvastatin  20 mg Oral q1800  . insulin aspart  0-15 Units Subcutaneous TID WC  . insulin aspart  0-5 Units Subcutaneous QHS  . insulin glargine  25 Units Subcutaneous Daily  . insulin regular  0-10 Units Intravenous TID WC  . pantoprazole  40 mg Oral Daily  . sodium chloride flush  3 mL Intravenous Q12H   Continuous Infusions: . sodium chloride  75 mL/hr (10/11/16 1118)  . dextrose 5 % and 0.45% NaCl Stopped (10/11/16 1117)  . insulin (NOVOLIN-R) infusion Stopped (10/11/16 1117)     LOS: 1 day    Time spent:   Zannie Cove, MD [facility] Pager [phone]  If 7PM-7AM, please contact night-coverage [email or web address] Password [facility] 10/11/2016, 11:24 AM

## 2016-10-11 NOTE — Progress Notes (Signed)
Patient OOB to BR on own - pulled out IVs and incontinent of urine.  Very lethargic and is a high fall risk.  Order obtained for tele-sitter.  Patient non-compliant with fall risk contract.  Back in bed with bed alarm on and bed in low position.  Will closely monitor.

## 2016-10-11 NOTE — Progress Notes (Signed)
Patient removed C-Pap at 22:00 does not want it back on. Will notify RT.

## 2016-10-12 LAB — GLUCOSE, CAPILLARY
GLUCOSE-CAPILLARY: 178 mg/dL — AB (ref 65–99)
GLUCOSE-CAPILLARY: 234 mg/dL — AB (ref 65–99)
GLUCOSE-CAPILLARY: 280 mg/dL — AB (ref 65–99)
GLUCOSE-CAPILLARY: 303 mg/dL — AB (ref 65–99)
GLUCOSE-CAPILLARY: 308 mg/dL — AB (ref 65–99)
GLUCOSE-CAPILLARY: 328 mg/dL — AB (ref 65–99)
Glucose-Capillary: 266 mg/dL — ABNORMAL HIGH (ref 65–99)
Glucose-Capillary: 191 mg/dL — ABNORMAL HIGH (ref 65–99)
Glucose-Capillary: 147 mg/dL — ABNORMAL HIGH (ref 65–99)
Glucose-Capillary: 176 mg/dL — ABNORMAL HIGH (ref 65–99)
Glucose-Capillary: 180 mg/dL — ABNORMAL HIGH (ref 65–99)
Glucose-Capillary: 191 mg/dL — ABNORMAL HIGH (ref 65–99)
Glucose-Capillary: 167 mg/dL — ABNORMAL HIGH (ref 65–99)
Glucose-Capillary: 192 mg/dL — ABNORMAL HIGH (ref 65–99)
Glucose-Capillary: 279 mg/dL — ABNORMAL HIGH (ref 65–99)
Glucose-Capillary: 297 mg/dL — ABNORMAL HIGH (ref 65–99)

## 2016-10-12 LAB — CBC
HEMATOCRIT: 42.5 % (ref 39.0–52.0)
HEMOGLOBIN: 14.1 g/dL (ref 13.0–17.0)
MCH: 27.7 pg (ref 26.0–34.0)
MCHC: 33.2 g/dL (ref 30.0–36.0)
MCV: 83.5 fL (ref 78.0–100.0)
Platelets: 244 10*3/uL (ref 150–400)
RBC: 5.09 MIL/uL (ref 4.22–5.81)
RDW: 13.8 % (ref 11.5–15.5)
WBC: 13.5 10*3/uL — ABNORMAL HIGH (ref 4.0–10.5)

## 2016-10-12 LAB — BASIC METABOLIC PANEL
Anion gap: 12 (ref 5–15)
Anion gap: 12 (ref 5–15)
BUN: 27 mg/dL — AB (ref 6–20)
BUN: 21 mg/dL — ABNORMAL HIGH (ref 6–20)
CHLORIDE: 118 mmol/L — AB (ref 101–111)
CHLORIDE: 112 mmol/L — AB (ref 101–111)
CO2: 20 mmol/L — AB (ref 22–32)
CO2: 20 mmol/L — AB (ref 22–32)
CREATININE: 1.55 mg/dL — AB (ref 0.61–1.24)
Calcium: 9 mg/dL (ref 8.9–10.3)
Calcium: 8.4 mg/dL — ABNORMAL LOW (ref 8.9–10.3)
Creatinine, Ser: 1.3 mg/dL — ABNORMAL HIGH (ref 0.61–1.24)
GFR calc Af Amer: >60 mL/min (ref >60)
GFR calc Af Amer: >60 mL/min (ref >60)
GFR calc non Af Amer: 55 mL/min — ABNORMAL LOW (ref >60)
GFR calc non Af Amer: >60 mL/min (ref >60)
Glucose, Bld: 273 mg/dL — ABNORMAL HIGH (ref 65–99)
Glucose, Bld: 244 mg/dL — ABNORMAL HIGH (ref 65–99)
POTASSIUM: 3.8 mmol/L (ref 3.5–5.1)
POTASSIUM: 3.3 mmol/L — AB (ref 3.5–5.1)
SODIUM: 144 mmol/L (ref 135–145)
Sodium: 150 mmol/L — ABNORMAL HIGH (ref 135–145)

## 2016-10-12 MED ORDER — ONDANSETRON HCL 4 MG/2ML IJ SOLN
4.0000 mg | Freq: Four times a day (QID) | INTRAMUSCULAR | Status: DC | PRN
  Administered 2016-10-12: 4 mg via INTRAVENOUS
  Filled 2016-10-12: qty 2

## 2016-10-12 MED ORDER — INSULIN GLARGINE 100 UNIT/ML ~~LOC~~ SOLN
30.0000 [IU] | Freq: Every day | SUBCUTANEOUS | Status: DC
  Administered 2016-10-12 – 2016-10-13 (×2): 30 [IU] via SUBCUTANEOUS
  Filled 2016-10-12 (×2): qty 0.3

## 2016-10-12 MED ORDER — INSULIN ASPART 100 UNIT/ML ~~LOC~~ SOLN
0.0000 [IU] | Freq: Every day | SUBCUTANEOUS | Status: DC
  Administered 2016-10-12: 3 [IU] via SUBCUTANEOUS

## 2016-10-12 MED ORDER — INSULIN ASPART 100 UNIT/ML ~~LOC~~ SOLN
0.0000 [IU] | Freq: Three times a day (TID) | SUBCUTANEOUS | Status: DC
  Administered 2016-10-12: 8 [IU] via SUBCUTANEOUS
  Administered 2016-10-12: 3 [IU] via SUBCUTANEOUS
  Administered 2016-10-13: 11 [IU] via SUBCUTANEOUS
  Administered 2016-10-13: 8 [IU] via SUBCUTANEOUS
  Administered 2016-10-13 – 2016-10-14 (×4): 5 [IU] via SUBCUTANEOUS
  Administered 2016-10-15: 2 [IU] via SUBCUTANEOUS
  Administered 2016-10-15 (×2): 5 [IU] via SUBCUTANEOUS
  Administered 2016-10-16 (×2): 3 [IU] via SUBCUTANEOUS
  Administered 2016-10-16: 2 [IU] via SUBCUTANEOUS
  Administered 2016-10-17: 5 [IU] via SUBCUTANEOUS
  Administered 2016-10-17: 3 [IU] via SUBCUTANEOUS

## 2016-10-12 NOTE — Progress Notes (Addendum)
PROGRESS NOTE    Brady Gibbs  ZHY:[id number] DOB: 18-May-1980 DOA: 10/10/2016 PCP: Patient, No Pcp Per  Brief Narrative: 36/M with IDDM, Tobacco use, non compliance admitted with DKA, stopped taking Insulin few days ago, admitted with DKA, AKI, severe dehydration last pm.  Assessment & Plan:   DKA (diabetic ketoacidoses) (HCC) -due to poor compliance -Insulin gap corrected and then opened again, restarted Insulin gtt -start Lantus again and SSI -Carb modified diet  AKi -due to dehydration -craetinine 3.2 on admission, improving to 1.6 -baseline around 1.2 -improving, cut down IVF and change to 1/2NS  Elevated troponin/-mild/flat trend -due to demand from DKA/AKI -no evidence of ACS   Hyponatremia -due to dehydration and hyperglycemia -improved, now Na high, continue 1/2 NS  Metabolic encephalopathy -due to DKA/AKI/electrolyte derangements and OSA -will order CPAP QHS  Obstructive sleep apnea -non compliant with CPAP -ordered CPAP QHS but didn't use this last pm  ?Mental health issues, hallucinations and depression -since losing his job over 1 year back according to his mother -will ask Psych to evaluate  DVT prophylaxis: add hep SQ Code Status: Full Code Family Communication: None at bedside Disposition Plan: Home in 1-2days, Tx to floor  Subjective: Feels ok, didn't use CPAP last pm, back on Insulin gtt, no N/V  Objective: Vitals:   10/12/16 0348 10/12/16 0400 10/12/16 0854 10/12/16 1240  BP: 130/85  129/86 (!) 154/130  Pulse: (!) 113 (!) 109 100 90  Resp: 16  14 14   Temp: 98.7 F (37.1 C)  98.5 F (36.9 C) 99.5 F (37.5 C)  TempSrc: Oral  Oral Oral  SpO2: 100% 99% 99% 100%  Weight:      Height:        Intake/Output Summary (Last 24 hours) at 10/12/16 1243 Last data filed at 10/12/16 0858  Gross per 24 hour  Intake          1598.75 ml  Output             1850 ml  Net          -251.25 ml   Filed Weights   10/10/16 2020  Weight: 127 kg (279 lb  15.8 oz)    Examination:   Gen: Awake, Alert, Oriented X 3,  HEENT: PERRLA, Neck supple, no JVD Lungs: Good air movement bilaterally, CTAB CVS: RRR,No Gallops,Rubs or new Murmurs Abd: soft, Non tender, non distended, BS present Extremities: No Cyanosis, Clubbing or edema Skin: no new rashes Psych: flat affect    Data Reviewed:   CBC:  Recent Labs Lab 10/10/16 1325 10/11/16 0521 10/12/16 0215  WBC 18.6* 13.8* 13.5*  HGB 14.9 14.5 14.1  HCT 50.6 44.5 42.5  MCV 95.5 84.4 83.5  PLT 246 240 244   Basic Metabolic Panel:  Recent Labs Lab 10/10/16 1842 10/10/16 2329 10/11/16 0521 10/11/16 2106 10/12/16 0215  NA 134* 144 149* 146* 150*  K 4.7 4.3 4.2 4.5 3.8  CL 101 117* 121* 115* 118*  CO2 7* 9* 19* 16* 20*  GLUCOSE 785* 360* 149* 342* 273*  BUN 50* 49* 35* 30* 27*  CREATININE 3.12* 2.34* 1.69* 1.70* 1.55*  CALCIUM 10.1 10.3 9.8 9.3 9.0  PHOS  --   --   --  2.0*  --    GFR: Estimated Creatinine Clearance: 90.2 mL/min (A) (by C-G formula based on SCr of 1.55 mg/dL (H)). Liver Function Tests:  Recent Labs Lab 10/10/16 1325  AST 19  ALT 17  ALKPHOS 85  BILITOT 2.3*  PROT 6.6  ALBUMIN 3.7    Recent Labs Lab 10/10/16 1325  LIPASE 49   No results for input(s): AMMONIA in the last 168 hours. Coagulation Profile: No results for input(s): INR, PROTIME in the last 168 hours. Cardiac Enzymes:  Recent Labs Lab 10/10/16 1155 10/10/16 1842 10/10/16 2329 10/11/16 0521  TROPONINI 0.05* 0.03* 0.03* 0.03*   BNP (last 3 results) No results for input(s): PROBNP in the last 8760 hours. HbA1C:  Recent Labs  10/10/16 1842  HGBA1C >15.5*   CBG:  Recent Labs Lab 10/12/16 0813 10/12/16 0855 10/12/16 0947 10/12/16 1059 10/12/16 1205  GLUCAP 178* 234* 180* 191* 167*   Lipid Profile: No results for input(s): CHOL, HDL, LDLCALC, TRIG, CHOLHDL, LDLDIRECT in the last 72 hours. Thyroid Function Tests: No results for input(s): TSH, T4TOTAL, FREET4,  T3FREE, THYROIDAB in the last 72 hours. Anemia Panel: No results for input(s): VITAMINB12, FOLATE, FERRITIN, TIBC, IRON, RETICCTPCT in the last 72 hours. Urine analysis:    Component Value Date/Time   COLORURINE STRAW (A) 10/10/2016 1435   APPEARANCEUR CLEAR 10/10/2016 1435   LABSPEC 1.021 10/10/2016 1435   PHURINE 5.0 10/10/2016 1435   GLUCOSEU >=500 (A) 10/10/2016 1435   HGBUR SMALL (A) 10/10/2016 1435   BILIRUBINUR NEGATIVE 10/10/2016 1435   KETONESUR 80 (A) 10/10/2016 1435   PROTEINUR NEGATIVE 10/10/2016 1435   UROBILINOGEN 0.2 01/01/2015 1445   NITRITE NEGATIVE 10/10/2016 1435   LEUKOCYTESUR NEGATIVE 10/10/2016 1435   Sepsis Labs: @LABRCNTIP (procalcitonin:4,lacticidven:4)  ) Recent Results (from the past 240 hour(s))  MRSA PCR Screening     Status: None   Collection Time: 10/10/16  8:23 PM  Result Value Ref Range Status   MRSA by PCR NEGATIVE NEGATIVE Final    Comment:        The GeneXpert MRSA Assay (FDA approved for NASAL specimens only), is one component of a comprehensive MRSA colonization surveillance program. It is not intended to diagnose MRSA infection nor to guide or monitor treatment for MRSA infections.          Radiology Studies: Ct Head Wo Contrast  Result Date: 10/10/2016 CLINICAL DATA:  Nausea vomiting EXAM: CT HEAD WITHOUT CONTRAST TECHNIQUE: Contiguous axial images were obtained from the base of the skull through the vertex without intravenous contrast. COMPARISON:  02/03/2010 FINDINGS: Brain: No evidence of acute infarction, hemorrhage, hydrocephalus, extra-axial collection or mass lesion/mass effect. Vascular: No hyperdense vessels.  No unexpected calcification. Skull: No fracture or suspicious bone lesion Sinuses/Orbits: Mucosal thickening in the sphenoid sinus. Mucous retention cyst or lobulated mucosal thickening in the right maxillary sinus. Mild ethmoid disease. No acute orbital abnormality Other: None IMPRESSION: No CT evidence for acute  intracranial abnormality. Electronically Signed   By: Jasmine Pang M.D.   On: 10/10/2016 18:29   Dg Chest Port 1 View  Result Date: 10/10/2016 CLINICAL DATA:  36 y/o  M; leukocytosis. EXAM: PORTABLE CHEST 1 VIEW COMPARISON:  03/21/2012 chest radiograph. FINDINGS: Stable normal cardiac silhouette given projection and technique. Low lung volumes accentuate pulmonary markings. No focal consolidation or effusion identified. Bones are unremarkable. IMPRESSION: Low lung volumes.  No acute pulmonary process identified Electronically Signed   By: Mitzi Hansen M.D.   On: 10/10/2016 17:39        Scheduled Meds: . atorvastatin  20 mg Oral q1800  . heparin subcutaneous  5,000 Units Subcutaneous Q8H  . insulin aspart  0-15 Units Subcutaneous TID WC  . insulin aspart  0-5 Units Subcutaneous QHS  . insulin glargine  30 Units Subcutaneous Daily  . pantoprazole  40 mg Oral Daily  . sodium chloride flush  3 mL Intravenous Q12H   Continuous Infusions: . sodium chloride 75 mL/hr at 10/12/16 1149  . dextrose 5 % and 0.45% NaCl Stopped (10/12/16 1150)  . insulin (NOVOLIN-R) infusion Stopped (10/12/16 1150)     LOS: 2 days    Time spent:   Zannie Cove, MD [facility] Pager [phone]  If 7PM-7AM, please contact night-coverage [email or web address] Password [facility] 10/12/2016, 12:43 PM

## 2016-10-12 NOTE — Progress Notes (Signed)
10/12/2016- Respiratory care note- CPAP set up for patient use for the night. Pt familiar with use.  No issues noted.

## 2016-10-13 ENCOUNTER — Inpatient Hospital Stay (HOSPITAL_COMMUNITY): Payer: Self-pay

## 2016-10-13 DIAGNOSIS — F17210 Nicotine dependence, cigarettes, uncomplicated: Secondary | ICD-10-CM

## 2016-10-13 DIAGNOSIS — F329 Major depressive disorder, single episode, unspecified: Secondary | ICD-10-CM

## 2016-10-13 DIAGNOSIS — Z818 Family history of other mental and behavioral disorders: Secondary | ICD-10-CM

## 2016-10-13 DIAGNOSIS — R112 Nausea with vomiting, unspecified: Secondary | ICD-10-CM

## 2016-10-13 DIAGNOSIS — E1310 Other specified diabetes mellitus with ketoacidosis without coma: Secondary | ICD-10-CM

## 2016-10-13 DIAGNOSIS — G4700 Insomnia, unspecified: Secondary | ICD-10-CM

## 2016-10-13 DIAGNOSIS — F419 Anxiety disorder, unspecified: Secondary | ICD-10-CM

## 2016-10-13 DIAGNOSIS — F0630 Mood disorder due to known physiological condition, unspecified: Secondary | ICD-10-CM

## 2016-10-13 LAB — BASIC METABOLIC PANEL
Anion gap: 9 (ref 5–15)
BUN: 16 mg/dL (ref 6–20)
CHLORIDE: 105 mmol/L (ref 101–111)
CO2: 25 mmol/L (ref 22–32)
CREATININE: 1.16 mg/dL (ref 0.61–1.24)
Calcium: 8.6 mg/dL — ABNORMAL LOW (ref 8.9–10.3)
GFR calc Af Amer: >60 mL/min (ref >60)
GFR calc non Af Amer: >60 mL/min (ref >60)
GLUCOSE: 286 mg/dL — AB (ref 65–99)
Potassium: 3 mmol/L — ABNORMAL LOW (ref 3.5–5.1)
SODIUM: 139 mmol/L (ref 135–145)

## 2016-10-13 LAB — CBC
HCT: 37 % — ABNORMAL LOW (ref 39.0–52.0)
HEMOGLOBIN: 11.8 g/dL — AB (ref 13.0–17.0)
MCH: 27.4 pg (ref 26.0–34.0)
MCHC: 31.9 g/dL (ref 30.0–36.0)
MCV: 85.8 fL (ref 78.0–100.0)
Platelets: 183 10*3/uL (ref 150–400)
RBC: 4.31 MIL/uL (ref 4.22–5.81)
RDW: 14.1 % (ref 11.5–15.5)
WBC: 9.3 10*3/uL (ref 4.0–10.5)

## 2016-10-13 LAB — GLUCOSE, CAPILLARY
GLUCOSE-CAPILLARY: 304 mg/dL — AB (ref 65–99)
Glucose-Capillary: 231 mg/dL — ABNORMAL HIGH (ref 65–99)
Glucose-Capillary: 256 mg/dL — ABNORMAL HIGH (ref 65–99)
Glucose-Capillary: 164 mg/dL — ABNORMAL HIGH (ref 65–99)

## 2016-10-13 MED ORDER — PANTOPRAZOLE SODIUM 40 MG PO TBEC
40.0000 mg | DELAYED_RELEASE_TABLET | Freq: Two times a day (BID) | ORAL | Status: DC
  Administered 2016-10-13 – 2016-10-17 (×8): 40 mg via ORAL
  Filled 2016-10-13 (×8): qty 1

## 2016-10-13 MED ORDER — INSULIN ASPART PROT & ASPART (70-30 MIX) 100 UNIT/ML ~~LOC~~ SUSP
20.0000 [IU] | Freq: Every day | SUBCUTANEOUS | Status: DC
  Administered 2016-10-14: 20 [IU] via SUBCUTANEOUS
  Filled 2016-10-13: qty 10

## 2016-10-13 MED ORDER — POTASSIUM CHLORIDE CRYS ER 20 MEQ PO TBCR
40.0000 meq | EXTENDED_RELEASE_TABLET | Freq: Once | ORAL | Status: AC
  Administered 2016-10-13: 40 meq via ORAL
  Filled 2016-10-13: qty 2

## 2016-10-13 MED ORDER — GABAPENTIN 600 MG PO TABS
300.0000 mg | ORAL_TABLET | Freq: Every day | ORAL | Status: DC
Start: 2016-10-13 — End: 2016-10-17
  Administered 2016-10-13 – 2016-10-16 (×4): 300 mg via ORAL
  Filled 2016-10-13 (×4): qty 1

## 2016-10-13 MED ORDER — POLYETHYLENE GLYCOL 3350 17 G PO PACK
17.0000 g | PACK | Freq: Every day | ORAL | Status: DC
  Administered 2016-10-13 – 2016-10-14 (×2): 17 g via ORAL
  Filled 2016-10-13 (×2): qty 1

## 2016-10-13 MED ORDER — DULOXETINE HCL 30 MG PO CPEP
30.0000 mg | ORAL_CAPSULE | Freq: Every day | ORAL | Status: DC
  Administered 2016-10-13 – 2016-10-17 (×5): 30 mg via ORAL
  Filled 2016-10-13 (×4): qty 1

## 2016-10-13 MED ORDER — INSULIN ASPART PROT & ASPART (70-30 MIX) 100 UNIT/ML ~~LOC~~ SUSP
30.0000 [IU] | Freq: Every day | SUBCUTANEOUS | Status: DC
  Administered 2016-10-13: 30 [IU] via SUBCUTANEOUS

## 2016-10-13 NOTE — Progress Notes (Signed)
PROGRESS NOTE    Brady Gibbs  ZOX:[id number] DOB: April 04, 1981 DOA: 10/10/2016 PCP: Patient, No Pcp Per  Brief Narrative: 36/M with IDDM, Tobacco use, non compliance admitted with DKA, stopped taking Insulin, admitted with DKA, AKI, severe dehydration. Then noted to have ongoing issues with dysphagia./lower esophagus for 6months and ongoing mental health problems per mother  Assessment & Plan:   DKA (diabetic ketoacidoses) (HCC) -due to poor compliance, stopped taking all meds including Insulin -Insulin gap corrected and then opened again, restarted Insulin gtt -finally improved and stable, on lantus but self pay, so resume Insulin 70/30 today, since NPo for Esophagram will give low dose in am -Carb modified diet  Dysphagia -ongoing for 5-58months per pt and mother -Esophagram pending today -continue PPI -denies symptoms of gastroparesis  AKi -due to dehydration -craetinine 3.2 on admission, improving to 1.6 -baseline around 1.2 -resolved, stop IVF today  Elevated troponin/-mild/flat trend -due to demand from DKA/AKI -no evidence of ACS   Hyponatremia -due to dehydration and hyperglycemia -improved, then became hypernatremic, IVF changed to 1/2NS -now resolved, stop 1/2 NS  Metabolic encephalopathy -due to DKA/AKI/electrolyte derangements and OSA -ordered CPAP QHS, unfortunately only using this for few minutes -improved  Obstructive sleep apnea -non compliant with CPAP -ordered CPAP QHS but didn't use this last pm  ?Mental health issues, hallucinations and depression -since losing his job over 1 year back according to his mother -will ask Psych to evaluate  DVT prophylaxis: add hep SQ Code Status: Full Code Family Communication: None at bedside Disposition Plan: transfer to floor, home tomorrow if stable  Subjective: C/o dysphagia, used CPAP only for -no N/V  Objective: Vitals:   10/13/16 0300 10/13/16 0716 10/13/16 0735 10/13/16 1000  BP: (!) 133/51  122/71 122/71 135/84  Pulse: 86 84 83 84  Resp: 14 16 14 15   Temp: 98.9 F (37.2 C) 98.8 F (37.1 C)    TempSrc: Oral Oral    SpO2: 100% 98% 99% 100%  Weight:      Height:        Intake/Output Summary (Last 24 hours) at 10/13/16 1132 Last data filed at 10/12/16 2300  Gross per 24 hour  Intake          3091.65 ml  Output             1050 ml  Net          2041.65 ml   Filed Weights   10/10/16 2020  Weight: 127 kg (279 lb 15.8 oz)    Examination:   Gen: Awake, Alert, Oriented X 3, well built male HEENT: PERRLA, Neck supple, no JVD Lungs: Good air movement bilaterally, CTAB CVS: RRR,No Gallops,Rubs or new Murmurs Abd: soft, Non tender, non distended, BS present Extremities: No Cyanosis, Clubbing or edema Skin: no new rashes Neuro: non focal Psychiatric: flat affect    Data Reviewed:   CBC:  Recent Labs Lab 10/10/16 1325 10/11/16 0521 10/12/16 0215 10/13/16 0153  WBC 18.6* 13.8* 13.5* 9.3  HGB 14.9 14.5 14.1 11.8*  HCT 50.6 44.5 42.5 37.0*  MCV 95.5 84.4 83.5 85.8  PLT 246 240 244 183   Basic Metabolic Panel:  Recent Labs Lab 10/11/16 0521 10/11/16 2106 10/12/16 0215 10/12/16 1459 10/13/16 0153  NA 149* 146* 150* 144 139  K 4.2 4.5 3.8 3.3* 3.0*  CL 121* 115* 118* 112* 105  CO2 19* 16* 20* 20* 25  GLUCOSE 149* 342* 273* 244* 286*  BUN 35* 30* 27* 21* 16  CREATININE 1.69* 1.70* 1.55* 1.30* 1.16  CALCIUM 9.8 9.3 9.0 8.4* 8.6*  PHOS  --  2.0*  --   --   --    GFR: Estimated Creatinine Clearance: 120.5 mL/min (by C-G formula based on SCr of 1.16 mg/dL). Liver Function Tests:  Recent Labs Lab 10/10/16 1325  AST 19  ALT 17  ALKPHOS 85  BILITOT 2.3*  PROT 6.6  ALBUMIN 3.7    Recent Labs Lab 10/10/16 1325  LIPASE 49   No results for input(s): AMMONIA in the last 168 hours. Coagulation Profile: No results for input(s): INR, PROTIME in the last 168 hours. Cardiac Enzymes:  Recent Labs Lab 10/10/16 1155 10/10/16 1842 10/10/16 2329  10/11/16 0521  TROPONINI 0.05* 0.03* 0.03* 0.03*   BNP (last 3 results) No results for input(s): PROBNP in the last 8760 hours. HbA1C:  Recent Labs  10/10/16 1842  HGBA1C >15.5*   CBG:  Recent Labs Lab 10/12/16 1205 10/12/16 1239 10/12/16 1658 10/12/16 2153 10/13/16 0737  GLUCAP 167* 192* 279* 297* 304*   Lipid Profile: No results for input(s): CHOL, HDL, LDLCALC, TRIG, CHOLHDL, LDLDIRECT in the last 72 hours. Thyroid Function Tests: No results for input(s): TSH, T4TOTAL, FREET4, T3FREE, THYROIDAB in the last 72 hours. Anemia Panel: No results for input(s): VITAMINB12, FOLATE, FERRITIN, TIBC, IRON, RETICCTPCT in the last 72 hours. Urine analysis:    Component Value Date/Time   COLORURINE STRAW (A) 10/10/2016 1435   APPEARANCEUR CLEAR 10/10/2016 1435   LABSPEC 1.021 10/10/2016 1435   PHURINE 5.0 10/10/2016 1435   GLUCOSEU >=500 (A) 10/10/2016 1435   HGBUR SMALL (A) 10/10/2016 1435   BILIRUBINUR NEGATIVE 10/10/2016 1435   KETONESUR 80 (A) 10/10/2016 1435   PROTEINUR NEGATIVE 10/10/2016 1435   UROBILINOGEN 0.2 01/01/2015 1445   NITRITE NEGATIVE 10/10/2016 1435   LEUKOCYTESUR NEGATIVE 10/10/2016 1435   Sepsis Labs: @LABRCNTIP (procalcitonin:4,lacticidven:4)  ) Recent Results (from the past 240 hour(s))  MRSA PCR Screening     Status: None   Collection Time: 10/10/16  8:23 PM  Result Value Ref Range Status   MRSA by PCR NEGATIVE NEGATIVE Final    Comment:        The GeneXpert MRSA Assay (FDA approved for NASAL specimens only), is one component of a comprehensive MRSA colonization surveillance program. It is not intended to diagnose MRSA infection nor to guide or monitor treatment for MRSA infections.          Radiology Studies: Dg Esophagus  Result Date: 10/13/2016 CLINICAL DATA:  Dysphagia to solids and liquids, progressive x3 months, oral thrush EXAM: ESOPHOGRAM / BARIUM SWALLOW / BARIUM TABLET STUDY TECHNIQUE: Combined double contrast and single  contrast examination performed using effervescent crystals, thick barium liquid, and thin barium liquid. The patient was observed with fluoroscopy swallowing a 13 mm barium sulphate tablet. FLUOROSCOPY TIME:  Fluoroscopy Time: 1 minutes 42 seconds of low-dose pulsed fluoroscopy Radiation Exposure Index (if provided by the fluoroscopic device): 27.9 mGy Number of Acquired Spot Images: 9 COMPARISON:  None. FINDINGS: Normal esophageal peristalsis. No fixed esophageal narrowing or stricture. A 13 mm barium tablet passed into the stomach without delay. Mildly irregular/shaggy appearance of the distal esophagus raises the possibility of distal esophagitis, likely on the basis of reflux. Small hiatal hernia. Station could not be assessed for gastroesophageal reflux due to the inability to clear his esophagus in the prone position. IMPRESSION: Irregular appearance of the distal esophagus, suggesting possible (reflex) esophagitis, although equivocal. Consider upper endoscopy for further evaluation as clinically warranted.  No fixed esophageal narrowing or stricture. Small hiatal hernia. Electronically Signed   By: Charline BillsSriyesh  Krishnan M.D.   On: 10/13/2016 09:36        Scheduled Meds: . atorvastatin  20 mg Oral q1800  . heparin subcutaneous  5,000 Units Subcutaneous Q8H  . insulin aspart  0-15 Units Subcutaneous TID WC  . insulin aspart  0-5 Units Subcutaneous QHS  . insulin aspart protamine- aspart  20 Units Subcutaneous Q breakfast  . insulin aspart protamine- aspart  30 Units Subcutaneous Q supper  . pantoprazole  40 mg Oral BID AC  . polyethylene glycol  17 g Oral Daily  . sodium chloride flush  3 mL Intravenous Q12H   Continuous Infusions:    LOS: 3 days    Time spent: 35min   Zannie CovePreetha Epimenio Schetter, MD [facility] Pager [phone]  If 7PM-7AM, please contact night-coverage [email or web address] Password [email or web address] 10/13/2016, 11:32 AM

## 2016-10-13 NOTE — Progress Notes (Addendum)
Inpatient Diabetes Program Recommendations  AACE/ADA: New Consensus Statement on Inpatient Glycemic Control (2015)  Target Ranges:  Prepandial:   less than 140 mg/dL      Peak postprandial:   less than 180 mg/dL (1-2 hours)      Critically ill patients:  140 - 180 mg/dL   Results for Brady Gibbs, Brady Gibbs (MRN [id number]) as of 10/13/2016 09:58  Ref. Range 10/12/2016 06:52 10/12/2016 08:13 10/12/2016 08:55 10/12/2016 09:47 10/12/2016 10:59 10/12/2016 12:05 10/12/2016 12:39 10/12/2016 16:58 10/12/2016 21:53 10/13/2016 07:37  Glucose-Capillary Latest Ref Range: 65 - 99 mg/dL 409176 (H) 811178 (H) 914234 (H) 180 (H) 191 (H) 167 (H) 192 (H) 279 (H) 297 (H) 304 (H)   Review of Glycemic Control  Diabetes history: DM2 Outpatient Diabetes medications: Novolin 70/30 30 units QAM, Novolin 70/30 15 units QPM, Metformin XR 500 mg BID Current orders for Inpatient glycemic control: 70/30 30 units BID, Novolog 0-15 units TID with meals, Novolog 0-5 units QHS  Inpatient Diabetes Program Recommendations: Insulin - Basal: Noted patient received Lantus 30 units this morning and basal insulin has changed from Lantus and 70/30. Currently ordered for 70/30 30 units BID. Will continue to follow. HgbA1C: A1C 15.5% on 10/10/16 indicating an average glucose of 398 mg/dl over the past 2-3 months. Patient needs to follow up with PCP regarding DM control.  NOTE: Spoke with patient about diabetes and home regimen for diabetes control. Patient reports that he use to go to Internal Medicine clinic and had an orange card but has not been to the clinic in quite a while. Per CM note, [facility]  Patient reports that he was told by doctors in the past to take 70/30 for 1 month and then begin using Metformin if needed for DM control. Patient has not taken any DM medications "in quite awhile". Patient has monitored glucose in the past but has not been checking glucose lately. Patient will  need prescription for a new glucometer and testing supplies.  Discussed A1C results (15.5% on 10/10/16) and explained that his current A1C indicates an average glucose of 398 mg/dl over the past 2-3 months. Discussed glucose and A1C goals. Discussed importance of checking CBGs and maintaining good CBG control to prevent long-term and short-term complications. Explained how hyperglycemia leads to damage within blood vessels which lead to the common complications seen with uncontrolled diabetes. Stressed to the patient the importance of improving glycemic control to prevent further complications from uncontrolled diabetes and of taking DM medications as prescribed. Discussed impact of nutrition, exercise, stress, sickness, and medications on diabetes control. Encouraged patient to check his glucose as prescribed and to keep a log book of glucose readings and insulin taken which he will need to take to doctor appointments. Explained how the doctor he follows up with can use the log book to continue to make insulin adjustments if needed. Patient verbalized understanding of information discussed and he states that he has no further questions at this time related to diabetes.  Thanks, Brady PennerMarie Ellis Koffler, RN, MSN, CDE Diabetes Coordinator Inpatient Diabetes Program [phone] (Team Pager from 8am to 5pm)

## 2016-10-13 NOTE — Consult Note (Signed)
[place] [facility] Consult   Reason for Consult:  Depression Referring Physician:  Dr. Broadus John Patient Identification: Brady Gibbs MRN:  [id number] Principal Diagnosis: DKA (diabetic ketoacidoses) [facility]) Diagnosis:   Patient Active Problem List   Diagnosis Date Noted  . DKA (diabetic ketoacidoses) ([address]) [E13.10] 01/01/2015  . Candidiasis [B37.9]   . Diabetic ketoacidosis without coma associated with type 2 diabetes mellitus ([place]) [E11.10]   . Metabolic acidosis [L24.4]   . Insomnia [G47.00] 08/10/2014  . Onychomycosis of right great toe [B35.1] 08/10/2014  . Body mass index (BMI) of 40.0-44.9 in adult [facility]) [Z68.41] 08/10/2014  . Hyperlipidemia [E78.5] 09/28/2012  . Condyloma acuminata [A63.0] 09/27/2012  . Severe obstructive sleep apnea [G47.33] 06/15/2012  . Diabetes mellitus, type 2 ([place]) [E11.9] 04/15/2012    Total Time spent with patient: 1 hour  Subjective:   Brady Gibbs is a 36 y.o. male patient admitted with nausea and vomiting.  HPI:  Brady Gibbs is a 36 y.o. male with PMH of IDDM, Tobacco use, non adherence, h/o DKA presented with nausea and vomiting. Patient and his mother is able to participate and able to provide information. Patient has been suffering with sleep apnea, day time sleepiness, unable to work, difficult to fall and stay in his sleep. Reportedly he lost more than 100 pounds in the last six years. He has been stressed about not able to go to work and self support himself over two years and also stressed about his friends are not providing support or help he needs. He is staying with his mother who is also on disability due to depression and diabetes. Patient was denied both medicaid and disability for the last few years. He is worried about his affordability for his treatment. Patient states some times he has been talking to himself about getting help here and there.   Past Psychiatric History: Denied  Risk to Self: Is patient at risk for suicide?:  No Risk to Others:   Prior Inpatient Therapy:   Prior Outpatient Therapy:    Past Medical History:  Past Medical History:  Diagnosis Date  . Diabetes mellitus without complication ([place])   . Sleep apnea     Past Surgical History:  Procedure Laterality Date  . FOOT SURGERY Right  at age 50   cyst removal  . HERNIA REPAIR     umbical hernia repair at 3 or 36 years old.   Family History: History reviewed. No pertinent family history. Family Psychiatric  History: mother with depression. He has younger brother but no known mental illness. Social History:  History  Alcohol Use No     History  Drug Use No    Social History   Social History  . Marital status: Married    Spouse name: N/A  . Number of children: N/A  . Years of education: 10   Social History Main Topics  . Smoking status: Current Every Day Smoker    Packs/day: 0.50    Types: Cigarettes  . Smokeless tobacco: Never Used  . Alcohol use No  . Drug use: No  . Sexual activity: Not Asked   Other Topics Concern  . None   Social History Narrative  . None   Additional Social History:    Allergies:  No Known Allergies  Labs:  Results for orders placed or performed during the hospital encounter of 10/10/16 (from the past 48 hour(s))  Glucose, capillary     Status: Abnormal   Collection Time: 10/11/16  3:59 PM  Result Value  Ref Range   Glucose-Capillary 320 (H) 65 - 99 mg/dL  Glucose, capillary     Status: Abnormal   Collection Time: 10/11/16  8:15 PM  Result Value Ref Range   Glucose-Capillary 410 (H) 65 - 99 mg/dL  Basic metabolic panel     Status: Abnormal   Collection Time: 10/11/16  9:06 PM  Result Value Ref Range   Sodium 146 (H) 135 - 145 mmol/L   Potassium 4.5 3.5 - 5.1 mmol/L   Chloride 115 (H) 101 - 111 mmol/L   CO2 16 (L) 22 - 32 mmol/L   Glucose, Bld 342 (H) 65 - 99 mg/dL   BUN 30 (H) 6 - 20 mg/dL   Creatinine, Ser 1.70 (H) 0.61 - 1.24 mg/dL   Calcium 9.3 8.9 - 10.3 mg/dL   GFR calc non  Af Amer 49 (L) >60 mL/min   GFR calc Af Amer 57 (L) >60 mL/min    Comment: (NOTE) The eGFR has been calculated using the CKD EPI equation. This calculation has not been validated in all clinical situations. eGFR's persistently <60 mL/min signify possible Chronic Kidney Disease.    Anion gap 15 5 - 15  Phosphorus     Status: Abnormal   Collection Time: 10/11/16  9:06 PM  Result Value Ref Range   Phosphorus 2.0 (L) 2.5 - 4.6 mg/dL  Glucose, capillary     Status: Abnormal   Collection Time: 10/11/16 11:31 PM  Result Value Ref Range   Glucose-Capillary 308 (H) 65 - 99 mg/dL  Glucose, capillary     Status: Abnormal   Collection Time: 10/12/16 12:34 AM  Result Value Ref Range   Glucose-Capillary 303 (H) 65 - 99 mg/dL  Glucose, capillary     Status: Abnormal   Collection Time: 10/12/16  1:41 AM  Result Value Ref Range   Glucose-Capillary 280 (H) 65 - 99 mg/dL  CBC     Status: Abnormal   Collection Time: 10/12/16  2:15 AM  Result Value Ref Range   WBC 13.5 (H) 4.0 - 10.5 K/uL   RBC 5.09 4.22 - 5.81 MIL/uL   Hemoglobin 14.1 13.0 - 17.0 g/dL   HCT 42.5 39.0 - 52.0 %   MCV 83.5 78.0 - 100.0 fL   MCH 27.7 26.0 - 34.0 pg   MCHC 33.2 30.0 - 36.0 g/dL   RDW 13.8 11.5 - 15.5 %   Platelets 244 150 - 400 K/uL  Basic metabolic panel     Status: Abnormal   Collection Time: 10/12/16  2:15 AM  Result Value Ref Range   Sodium 150 (H) 135 - 145 mmol/L   Potassium 3.8 3.5 - 5.1 mmol/L   Chloride 118 (H) 101 - 111 mmol/L   CO2 20 (L) 22 - 32 mmol/L   Glucose, Bld 273 (H) 65 - 99 mg/dL   BUN 27 (H) 6 - 20 mg/dL   Creatinine, Ser 1.55 (H) 0.61 - 1.24 mg/dL   Calcium 9.0 8.9 - 10.3 mg/dL   GFR calc non Af Amer 55 (L) >60 mL/min   GFR calc Af Amer >60 >60 mL/min    Comment: (NOTE) The eGFR has been calculated using the CKD EPI equation. This calculation has not been validated in all clinical situations. eGFR's persistently <60 mL/min signify possible Chronic Kidney Disease.    Anion gap 12  5 - 15  Glucose, capillary     Status: Abnormal   Collection Time: 10/12/16  2:44 AM  Result Value Ref Range  Glucose-Capillary 266 (H) 65 - 99 mg/dL  Glucose, capillary     Status: Abnormal   Collection Time: 10/12/16  3:42 AM  Result Value Ref Range   Glucose-Capillary 328 (H) 65 - 99 mg/dL  Glucose, capillary     Status: Abnormal   Collection Time: 10/12/16  4:45 AM  Result Value Ref Range   Glucose-Capillary 191 (H) 65 - 99 mg/dL  Glucose, capillary     Status: Abnormal   Collection Time: 10/12/16  5:50 AM  Result Value Ref Range   Glucose-Capillary 147 (H) 65 - 99 mg/dL  Glucose, capillary     Status: Abnormal   Collection Time: 10/12/16  6:52 AM  Result Value Ref Range   Glucose-Capillary 176 (H) 65 - 99 mg/dL  Glucose, capillary     Status: Abnormal   Collection Time: 10/12/16  8:13 AM  Result Value Ref Range   Glucose-Capillary 178 (H) 65 - 99 mg/dL  Glucose, capillary     Status: Abnormal   Collection Time: 10/12/16  8:55 AM  Result Value Ref Range   Glucose-Capillary 234 (H) 65 - 99 mg/dL  Glucose, capillary     Status: Abnormal   Collection Time: 10/12/16  9:47 AM  Result Value Ref Range   Glucose-Capillary 180 (H) 65 - 99 mg/dL  Glucose, capillary     Status: Abnormal   Collection Time: 10/12/16 10:59 AM  Result Value Ref Range   Glucose-Capillary 191 (H) 65 - 99 mg/dL  Glucose, capillary     Status: Abnormal   Collection Time: 10/12/16 12:05 PM  Result Value Ref Range   Glucose-Capillary 167 (H) 65 - 99 mg/dL  Glucose, capillary     Status: Abnormal   Collection Time: 10/12/16 12:39 PM  Result Value Ref Range   Glucose-Capillary 192 (H) 65 - 99 mg/dL  Basic metabolic panel     Status: Abnormal   Collection Time: 10/12/16  2:59 PM  Result Value Ref Range   Sodium 144 135 - 145 mmol/L   Potassium 3.3 (L) 3.5 - 5.1 mmol/L   Chloride 112 (H) 101 - 111 mmol/L   CO2 20 (L) 22 - 32 mmol/L   Glucose, Bld 244 (H) 65 - 99 mg/dL   BUN 21 (H) 6 - 20 mg/dL    Creatinine, Ser 1.30 (H) 0.61 - 1.24 mg/dL   Calcium 8.4 (L) 8.9 - 10.3 mg/dL   GFR calc non Af Amer >60 >60 mL/min   GFR calc Af Amer >60 >60 mL/min    Comment: (NOTE) The eGFR has been calculated using the CKD EPI equation. This calculation has not been validated in all clinical situations. eGFR's persistently <60 mL/min signify possible Chronic Kidney Disease.    Anion gap 12 5 - 15  Glucose, capillary     Status: Abnormal   Collection Time: 10/12/16  4:58 PM  Result Value Ref Range   Glucose-Capillary 279 (H) 65 - 99 mg/dL  Glucose, capillary     Status: Abnormal   Collection Time: 10/12/16  9:53 PM  Result Value Ref Range   Glucose-Capillary 297 (H) 65 - 99 mg/dL  CBC     Status: Abnormal   Collection Time: 10/13/16  1:53 AM  Result Value Ref Range   WBC 9.3 4.0 - 10.5 K/uL   RBC 4.31 4.22 - 5.81 MIL/uL   Hemoglobin 11.8 (L) 13.0 - 17.0 g/dL   HCT 37.0 (L) 39.0 - 52.0 %   MCV 85.8 78.0 - 100.0 fL  MCH 27.4 26.0 - 34.0 pg   MCHC 31.9 30.0 - 36.0 g/dL   RDW 14.1 11.5 - 15.5 %   Platelets 183 150 - 400 K/uL  Basic metabolic panel     Status: Abnormal   Collection Time: 10/13/16  1:53 AM  Result Value Ref Range   Sodium 139 135 - 145 mmol/L   Potassium 3.0 (L) 3.5 - 5.1 mmol/L   Chloride 105 101 - 111 mmol/L   CO2 25 22 - 32 mmol/L   Glucose, Bld 286 (H) 65 - 99 mg/dL   BUN 16 6 - 20 mg/dL   Creatinine, Ser 1.16 0.61 - 1.24 mg/dL   Calcium 8.6 (L) 8.9 - 10.3 mg/dL   GFR calc non Af Amer >60 >60 mL/min   GFR calc Af Amer >60 >60 mL/min    Comment: (NOTE) The eGFR has been calculated using the CKD EPI equation. This calculation has not been validated in all clinical situations. eGFR's persistently <60 mL/min signify possible Chronic Kidney Disease.    Anion gap 9 5 - 15  Glucose, capillary     Status: Abnormal   Collection Time: 10/13/16  7:37 AM  Result Value Ref Range   Glucose-Capillary 304 (H) 65 - 99 mg/dL    Current Facility-Administered Medications   Medication Dose Route Frequency Provider Last Rate Last Dose  . acetaminophen (TYLENOL) tablet 650 mg  650 mg Oral Q6H PRN Kinnie Feil, MD       Or  . acetaminophen (TYLENOL) suppository 650 mg  650 mg Rectal Q6H PRN Buriev, Arie Sabina, MD      . atorvastatin (LIPITOR) tablet 20 mg  20 mg Oral q1800 Kinnie Feil, MD   20 mg at 10/12/16 1805  . dextrose 50 % solution 25 mL  25 mL Intravenous PRN Kinnie Feil, MD      . heparin injection 5,000 Units  5,000 Units Subcutaneous Q8H Domenic Polite, MD   5,000 Units at 10/12/16 1425  . insulin aspart (novoLOG) injection 0-15 Units  0-15 Units Subcutaneous TID WC Domenic Polite, MD   11 Units at 10/13/16 0805  . insulin aspart (novoLOG) injection 0-5 Units  0-5 Units Subcutaneous QHS Domenic Polite, MD   3 Units at 10/12/16 2206  . insulin aspart protamine- aspart (NOVOLOG MIX 70/30) injection 20 Units  20 Units Subcutaneous Q breakfast Domenic Polite, MD      . insulin aspart protamine- aspart (NOVOLOG MIX 70/30) injection 30 Units  30 Units Subcutaneous Q supper Domenic Polite, MD      . ondansetron [facility]) injection 4 mg  4 mg Intravenous Q6H PRN Domenic Polite, MD   4 mg at 10/12/16 1042  . pantoprazole (PROTONIX) EC tablet 40 mg  40 mg Oral BID AC Domenic Polite, MD      . polyethylene glycol [facility] / GLYCOLAX) packet 17 g  17 g Oral Daily Domenic Polite, MD   17 g at 10/13/16 0956  . sodium chloride flush (NS) 0.9 % injection 3 mL  3 mL Intravenous Q12H Kinnie Feil, MD   3 mL at 10/13/16 0946    Musculoskeletal: Strength & Muscle Tone: decreased Gait & Station: unable to stand Patient leans: N/A  Psychiatric Specialty Exam: Physical Exam as per history and physical  ROS  No Fever-chills, No Headache, No changes with Vision or hearing, reports vertigo No problems swallowing food or Liquids, No Chest pain, Cough or Shortness of Breath, No Abdominal pain, No Nausea or Vommitting,  Bowel movements are  regular, No Blood in stool or Urine, No dysuria, No new skin rashes or bruises, No new joints pains-aches,  No new weakness, tingling, numbness in any extremity, No recent weight gain or loss, No polyuria, polydypsia or polyphagia,  A full 10 point Review of Systems was done, except as stated above, all other Review of Systems were negative.  Blood pressure 135/84, pulse 84, temperature 98.8 F (37.1 C), temperature source Oral, resp. rate 15, height '6\' 1"'$  (1.854 m), weight 127 kg (279 lb 15.8 oz), SpO2 100 %.Body mass index is 36.94 kg/m.  General Appearance: Casual  Eye Contact:  Good  Speech:  Clear and Coherent  Volume:  Decreased  Mood:  Anxious and Depressed  Affect:  Appropriate and Congruent  Thought Process:  Coherent and Goal Directed  Orientation:  Full (Time, Place, and Person)  Thought Content:  WDL and Rumination  Suicidal Thoughts:  No  Homicidal Thoughts:  No  Memory:  Immediate;   Good Recent;   Fair Remote;   Fair  Judgement:  Fair  Insight:  Good  Psychomotor Activity:  Decreased  Concentration:  Concentration: Good and Attention Span: Fair  Recall:  Good  Fund of Knowledge:  Good  Language:  Good  Akathisia:  Negative  Handed:  Right  AIMS (if indicated):     Assets:  Communication Skills Desire for Improvement Housing Leisure Time Resilience Social Support Transportation  ADL's:  Intact  Cognition:  WNL  Sleep:        Treatment Plan Summary: Daily contact with patient to assess and evaluate symptoms and progress in treatment and Medication management   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   Patient does not meet criteria for psychiatric inpatient admission. Supportive therapy provided about ongoing stressors.  Ambrose Finland, MD 10/13/2016 12:03 PM

## 2016-10-13 NOTE — Progress Notes (Signed)
Transferred pt via wheelchair with RN and NT.  bp stable.  Brady Gibbs, Brady Gibbs

## 2016-10-13 NOTE — Care Management Note (Addendum)
Case Management Note  Patient Details  Name: Brady Gibbs MRN: [id number] Date of Birth: 17-Sep-1980  Subjective/Objective:     Pt admitted with DKA               Action/Plan:  PTA independent with mobility from home with mom.  Pt confirmed he does not have insurance nor PCP relationship.   Pt states [facility] and had orange card.  Pt states he stopped going to the clinic due to the bills piling up and the orange card was expired.  CM gave pt option of re-establishing relationship with IM clinic or initiating new PCP at clinic - pt chose clinic.  CM will attempt to arrange clinic appt and secure option for low cost [facility].     Expected Discharge Date:  10/18/16               Expected Discharge Plan:  Home/Self Care  In-House Referral:     Discharge planning Services  CM Consult, Indigent Health Clinic  Post Acute Care Choice:    Choice offered to:     DME Arranged:    DME Agency:     HH Arranged:    HH Agency:     Status of Service:     If discussed at [employer] Meetings, dates discussed:    Additional Comments: 10/14/2016 CM spoke directly with pt and provided appt time and information regarding clinic and pharmacy.  Actual brochure for clinic and pharmacy provided by way of 5W CM.  10/13/16  [facility] 8:30[facility].  CM will provide both clinic and pharmacy brochure. Cherylann ParrClaxton, Adelie Croswell S, RN 10/13/2016, 10:39 AM

## 2016-10-14 DIAGNOSIS — R933 Abnormal findings on diagnostic imaging of other parts of digestive tract: Secondary | ICD-10-CM

## 2016-10-14 DIAGNOSIS — E1110 Type 2 diabetes mellitus with ketoacidosis without coma: Principal | ICD-10-CM

## 2016-10-14 DIAGNOSIS — R1310 Dysphagia, unspecified: Secondary | ICD-10-CM

## 2016-10-14 LAB — TROPONIN I
Troponin I: <0.03 ng/mL (ref <0.03)
Troponin I: <0.03 ng/mL (ref <0.03)

## 2016-10-14 LAB — GLUCOSE, CAPILLARY
GLUCOSE-CAPILLARY: 246 mg/dL — AB (ref 65–99)
Glucose-Capillary: 249 mg/dL — ABNORMAL HIGH (ref 65–99)
Glucose-Capillary: 245 mg/dL — ABNORMAL HIGH (ref 65–99)
Glucose-Capillary: 165 mg/dL — ABNORMAL HIGH (ref 65–99)

## 2016-10-14 MED ORDER — SUCRALFATE 1 GM/10ML PO SUSP
1.0000 g | Freq: Three times a day (TID) | ORAL | Status: DC
  Administered 2016-10-14 – 2016-10-17 (×13): 1 g via ORAL
  Filled 2016-10-14 (×12): qty 10

## 2016-10-14 MED ORDER — GI COCKTAIL ~~LOC~~
30.0000 mL | Freq: Once | ORAL | Status: AC
  Administered 2016-10-14: 30 mL via ORAL
  Filled 2016-10-14: qty 30

## 2016-10-14 MED ORDER — GI COCKTAIL ~~LOC~~: 30.0000 mL | Freq: Three times a day (TID) | ORAL | Status: DC | PRN

## 2016-10-14 MED ORDER — INSULIN ASPART PROT & ASPART (70-30 MIX) 100 UNIT/ML ~~LOC~~ SUSP
30.0000 [IU] | Freq: Two times a day (BID) | SUBCUTANEOUS | Status: DC
  Administered 2016-10-14 – 2016-10-17 (×6): 30 [IU] via SUBCUTANEOUS

## 2016-10-14 MED ORDER — POTASSIUM CHLORIDE CRYS ER 20 MEQ PO TBCR
40.0000 meq | EXTENDED_RELEASE_TABLET | ORAL | Status: AC
  Administered 2016-10-14 (×2): 40 meq via ORAL
  Filled 2016-10-14 (×2): qty 2

## 2016-10-14 MED ORDER — NITROGLYCERIN 0.4 MG SL SUBL: 0.4000 mg | SUBLINGUAL_TABLET | SUBLINGUAL | Status: DC | PRN

## 2016-10-14 NOTE — Procedures (Signed)
CPAP is bedside and ready for use.  Patient stated he would place on himself when ready for bed and did not need any assistance. 

## 2016-10-14 NOTE — Progress Notes (Signed)
[facility] Progress Note  Patient: Brady Gibbs FXT:[id number]   PCP: Patient, No Pcp Per DOB: 06/26/1980   DOA: 10/10/2016   DOS: 10/14/2016   Date of Service: the patient was seen and examined on 10/14/2016  Subjective: Patient complains about substernal chest pain. Present since admission. Primarily associated with diet intake. No nausea no vomiting or shortness of breath. No diarrhea no constipation. No fever no chills no leg pain.  Brief hospital course: Pt. with PMH of diabetes, obesity, OSA on CPAP, depression; admitted on 10/10/2016, presented with complaint of nausea and vomiting, was found to have DKA with dehydration. Patient had nausea and vomiting actually present for Lasix 100 also found to have severe reflux esophagitis. GI consulted. Currently further plan is follow-up on GI recommendation.  Assessment and Plan: 1. DKA Due to poor compliance as the patient stopped taking all his medication including insulin recently. Currently insulin dose has been increased for long-acting. Continue sliding scale. Appreciate diabetes coordinator intervention. Carb modified diet.  2. Dysphagia for solid. Severe reflux esophagitis. GI consulted. Continue PPI, Carafate, GI cocktail when necessary. Scheduled EGD by GI tomorrow. We'll follow results.  3. Acute kidney injury. Due to dehydration. Creatinine significantly elevated on admission 3.2. Currently resolved and back to normal baseline. Monitor.  4. Chest pain. Abnormal EKG. Elevated troponin. Patient initially had elevated troponin on admission which is actually secondary to poor clearance from acute kidney injury. Patient did have complaints of some chest pain today EKG was showing diffuse ST elevation although not consistent with an ACS or systemic picture. Discussed with cardiology on call. Troponin negative as well. Symptoms classical for GI chest pain. Due to the presence of ST changes as well as concern for  pericarditis we'll get an echocardiogram as well as checking ESR and CRP in the morning. Should not preclude from him getting EGD tomorrow.  5. Hyponatremia. Currently stopped on resolved. We'll monitor.  6. Metabolic encephalopathy. Obstructive sleep apnea Currently resolved. Noncompliant with CPAP.  Monitor.  7. History of depression and anxiety. Hallucination as well as noncompliance with medical regimen. Next and psychiatry consulted. Recommended to start Cymbalta and gabapentin. No indication for inpatient admission.  Diet: Carb modified diet DVT Prophylaxis: subcutaneous Heparin  Advance goals of care discussion: Full code  Family Communication: no family was present at bedside, at the time of interview.   Disposition:  Discharge to home.  Consultants:gastroenterology, psychiatry Procedures: none  Antibiotics: Anti-infectives    None       Objective: Physical Exam: Vitals:   10/13/16 2010 10/13/16 2026 10/14/16 0639 10/14/16 1347  BP: (!) 147/123 126/72 117/64 (!) 141/74  Pulse:  (!) 102 79 88  Resp:  '17 18 16  '$ Temp:  98.5 F (36.9 C) 98.1 F (36.7 C) 98.1 F (36.7 C)  TempSrc:    Oral  SpO2:  99% 99% 96%  Weight:      Height:        Intake/Output Summary (Last 24 hours) at 10/14/16 1829 Last data filed at 10/14/16 1347  Gross per 24 hour  Intake              120 ml  Output                0 ml  Net              120 ml   Filed Weights   10/10/16 2020  Weight: 127 kg (279 lb 15.8 oz)   General: Alert, Awake and Oriented  to Time, Place and Person. Appear in mild distress, affect appropriate Eyes: PERRL, Conjunctiva normal ENT: Oral Mucosa clear moist. Neck: no JVD, no Abnormal Mass Or lumps Cardiovascular: S1 and S2 Present, no Murmur, Peripheral Pulses Present Respiratory: normal respiratory effort, Bilateral Air entry equal and Decreased, no use of accessory muscle, Clear to Auscultation, no Crackles, no wheezes Abdomen: Bowel Sound present,  Soft and no tenderness, no hernia Skin: no redness, no Rash, no induration Extremities: no Pedal edema, no calf tenderness Neurologic: Grossly no focal neuro deficit. Bilaterally Equal motor strength Data Reviewed: CBC:  Recent Labs Lab 10/10/16 1325 10/11/16 0521 10/12/16 0215 10/13/16 0153  WBC 18.6* 13.8* 13.5* 9.3  HGB 14.9 14.5 14.1 11.8*  HCT 50.6 44.5 42.5 37.0*  MCV 95.5 84.4 83.5 85.8  PLT 246 240 244 160   Basic Metabolic Panel:  Recent Labs Lab 10/11/16 0521 10/11/16 2106 10/12/16 0215 10/12/16 1459 10/13/16 0153  NA 149* 146* 150* 144 139  K 4.2 4.5 3.8 3.3* 3.0*  CL 121* 115* 118* 112* 105  CO2 19* 16* 20* 20* 25  GLUCOSE 149* 342* 273* 244* 286*  BUN 35* 30* 27* 21* 16  CREATININE 1.69* 1.70* 1.55* 1.30* 1.16  CALCIUM 9.8 9.3 9.0 8.4* 8.6*  PHOS  --  2.0*  --   --   --     Liver Function Tests:  Recent Labs Lab 10/10/16 1325  AST 19  ALT 17  ALKPHOS 85  BILITOT 2.3*  PROT 6.6  ALBUMIN 3.7    Recent Labs Lab 10/10/16 1325  LIPASE 49   No results for input(s): AMMONIA in the last 168 hours. Coagulation Profile: No results for input(s): INR, PROTIME in the last 168 hours. Cardiac Enzymes:  Recent Labs Lab 10/10/16 1155 10/10/16 1842 10/10/16 2329 10/11/16 0521 10/14/16 1228  TROPONINI 0.05* 0.03* 0.03* 0.03* <0.03   BNP (last 3 results) No results for input(s): PROBNP in the last 8760 hours. CBG:  Recent Labs Lab 10/13/16 1554 10/13/16 2033 10/14/16 0759 10/14/16 1240 10/14/16 1713  GLUCAP 256* 164* 249* 246* 245*   Studies: No results found.  Scheduled Meds: . atorvastatin  20 mg Oral q1800  . DULoxetine  30 mg Oral Daily  . gabapentin  300 mg Oral QHS  . heparin subcutaneous  5,000 Units Subcutaneous Q8H  . insulin aspart  0-15 Units Subcutaneous TID WC  . insulin aspart  0-5 Units Subcutaneous QHS  . insulin aspart protamine- aspart  30 Units Subcutaneous BID WC  . pantoprazole  40 mg Oral BID AC  .  polyethylene glycol  17 g Oral Daily  . sodium chloride flush  3 mL Intravenous Q12H  . sucralfate  1 g Oral TID WC & HS   Continuous Infusions: PRN Meds: acetaminophen **OR** acetaminophen, dextrose, gi cocktail, nitroGLYCERIN, ondansetron (ZOFRAN) IV  Time spent: 45 minutes  Author: Berle Mull, MD [facility] Pager: [phone] 10/14/2016 6:29 PM  If 7PM-7AM, please contact night-coverage at [email or web address], password [facility]

## 2016-10-14 NOTE — Progress Notes (Signed)
Inpatient Diabetes Program Recommendations  AACE/ADA: New Consensus Statement on Inpatient Glycemic Control (2015)  Target Ranges:  Prepandial:   less than 140 mg/dL      Peak postprandial:   less than 180 mg/dL (1-2 hours)      Critically ill patients:  140 - 180 mg/dL   Results for Brady Gibbs, Nikhil (MRN [id number]) as of 10/14/2016 09:14  Ref. Range 10/13/2016 07:37 10/13/2016 12:34 10/13/2016 15:54 10/13/2016 20:33 10/14/2016 07:59  Glucose-Capillary Latest Ref Range: 65 - 99 mg/dL 409304 (H) 811231 (H) 914256 (H) 164 (H) 249 (H)   Review of Glycemic Control  Current orders for Inpatient glycemic control: 70/30 20 units QAM, 70/30 30 units QPM, Novolog 0-15 units TID with meals, Novolog 0-5 units QHS  Inpatient Diabetes Program Recommendations:  Insulin - Basal: Patient received Lantus 30 units @ 9:00 am on 10/13/16 and 70/30 30 units at 16:21 on 10/13/16. Noted 70/30 order was decreased to 20 units QAM and 30 units QPM yesterday. Recommend increasing 70/30 to 30 units BID. HgbA1C: A1C 15.5% on 10/10/16 indicating an average glucose of 398 mg/dl over the past 2-3 months. Patient needs to follow up with PCP regarding DM control.  Thanks, Orlando PennerMarie Cindel Daugherty, RN, MSN, CDE Diabetes Coordinator Inpatient Diabetes Program [phone] (Team Pager from 8am to 5pm)

## 2016-10-14 NOTE — Consult Note (Signed)
                                                                           [facility] Consult: 1:00 PM 10/14/2016  LOS: 4 days    Referring Provider: Dr Patel  Primary Care Physician:  Patient, No Pcp Per was pt of Dr Hoffman at [facility] but stopped coming to appts in 2017.    Primary Gastroenterologist:  unassigned    Reason for Consultation:  Dysphagia.     HPI: Brady Gibbs is a 36 y.o. male.  PMH IDDM.  Obesity, BMI 37.  OSA on CPAP.  Depression, anxiety. Oral candidiasis 12/2014.  GERD and minor CGE 2016, Protonix added 2016.  HLD. Non-adherence.  DKA, AKI 12/2014 (Hgb A1 c 14). Likely CKD but does not have recent labs.  Umbilical hernia repair as toddler.  Admitted with DKA, dehydration.  Had not taken insulin for several days and developed N/V, mild abdominal discomfort.  Hgb A1c 15.5 (avg glucose 398 over 3 months)  Dysphagia dates to at least 12/2014.    Admitted 6/16 with DKA, glucose 1,300.  AKI.  All improving.   With hydration Hgb has gone from 14.9 to 11.8.  MCV normal.  Leukocytosis resolved.  Gastric occult blood +.   Esophagram 10/13/16:  Irregular appearance of the distal esophagus, suggesting possible (reflex) esophagitis, although equivocal. Consider upper endoscopy for further evaluation. No fixed esophageal narrowing or stricture.  Small hiatal hernia.  Patient describes an odd pattern of difficulty swallowing that dates back several years. He will chew his food but he has trouble getting the food to the back of his mouth and actually swallowing it. Once he swallows it it does not get stuck. He feels food accumulating underneath his tongue. He points to the underside of his jaw as the region of difficulty swallowing.   Patient previously took omeprazole religiously but after losing his job at [employer] along with his healthcare insurance he  stopped taking the omeprazole. He does not get frequent heartburn. He's never undergone upper endoscopy. The coffee ground emesis which was occurring for a few days prior to this current admission was new and it has resolved with correction of his DKA. Stools are consistently brown, usually every day and never bloody.  Patient doesn't use NSAIDs, rarely drinks beer.    Past Medical History:  Diagnosis Date  . Diabetes mellitus without complication (HCC)   . Sleep apnea     Past Surgical History:  Procedure Laterality Date  . FOOT SURGERY Right  at age 15   cyst removal  . HERNIA REPAIR     umbical hernia repair at 1 or 36 years old.    Prior to Admission medications   Medication Sig Start Date End Date Taking? Authorizing Provider  atorvastatin (LIPITOR) 20 MG tablet Take 1 tablet (20 mg total) by mouth daily at 6 PM. Patient not taking: Reported on 01/01/2016 01/03/15   Boswell, Nathan, MD  insulin NPH-regular Human (NOVOLIN 70/30) (70-30) 100 UNIT/ML injection Inject 30 units under the skin with food in the morning. Inject 15 units under the skin before bed. Patient not taking: Reported on 01/01/2016 01/03/15   Boswell, Nathan, MD    metFORMIN (GLUCOPHAGE XR) 500 MG 24 hr tablet Take 1 tablet (500 mg total) by mouth daily with breakfast. Increase to twice a day after 1 week. Patient not taking: Reported on 01/01/2016 01/03/15   Boswell, Nathan, MD  pantoprazole (PROTONIX) 40 MG tablet Take 1 tablet (40 mg total) by mouth daily. Patient not taking: Reported on 01/01/2016 08/09/14 08/17/15  Hoffman, Erik C, DO  podofilox (CONDYLOX) 0.5 % gel Apply topically 2 (two) times daily. Apply for 3 consecutive days followed by 4 days without treatment. Repeat as needed for up to 4 weeks. Patient not taking: Reported on 01/01/2016 08/09/14   Hoffman, Erik C, DO    Scheduled Meds: . atorvastatin  20 mg Oral q1800  . DULoxetine  30 mg Oral Daily  . gabapentin  300 mg Oral QHS  . gi cocktail  30 mL Oral Once  .  heparin subcutaneous  5,000 Units Subcutaneous Q8H  . insulin aspart  0-15 Units Subcutaneous TID WC  . insulin aspart  0-5 Units Subcutaneous QHS  . insulin aspart protamine- aspart  20 Units Subcutaneous Q breakfast  . insulin aspart protamine- aspart  30 Units Subcutaneous Q supper  . pantoprazole  40 mg Oral BID AC  . polyethylene glycol  17 g Oral Daily  . sodium chloride flush  3 mL Intravenous Q12H  . sucralfate  1 g Oral TID WC & HS   Infusions:  PRN Meds: acetaminophen **OR** acetaminophen, dextrose, gi cocktail, nitroGLYCERIN, ondansetron (ZOFRAN) IV   Allergies as of 10/10/2016  . (No Known Allergies)    History reviewed. No pertinent family history.  Social History   Social History  . Marital status: Married    Spouse name: N/A  . Number of children: N/A  . Years of education: 10   Occupational History  . Not on file.   Social History Main Topics  . Smoking status: Current Every Day Smoker    Packs/day: 0.50    Types: Cigarettes  . Smokeless tobacco: Never Used  . Alcohol use No  . Drug use: No  . Sexual activity: Not on file   Other Topics Concern  . Not on file   Social History Narrative  . No narrative on file    REVIEW OF SYSTEMS: Constitutional:  With recent illness was experiencing weakness, fatigue. Generally has good energy level ENT:  No nose bleeds Pulm:  No trouble breathing. No hemoptysis. CV:  No palpitations, no LE edema.  GU:  No hematuria, no frequency GI:  Per HPI Heme:  No unusual bleeding or excessive bruising.   Transfusions:  Does not recall previous transfusions. Neuro:  No headaches, no peripheral tingling or numbness Derm:  No itching, no rash or sores.  Endocrine:  Polydipsia, polyuria which has improved with correction of DKA. Immunization:  Not inquired as to recent or previous immunizations. Travel:  None beyond local counties in last few months.    PHYSICAL EXAM: Vital signs in last 24 hours: Vitals:    10/13/16 2026 10/14/16 0639  BP: 126/72 117/64  Pulse: (!) 102 79  Resp: 17 18  Temp: 98.5 F (36.9 C) 98.1 F (36.7 C)   Wt Readings from Last 3 Encounters:  10/10/16 127 kg (279 lb 15.8 oz)  01/01/15 127 kg (279 lb 15.8 oz)  09/12/14 (!) 139 kg (306 lb 8 oz)    General: Moderately obese, pleasant, comfortable. Does not look ill. Head:  No facial asymmetry or swelling. No signs of head trauma.  Eyes:    No scleral icterus, no conjunctival pallor. Ears:  Not hard of hearing  Nose:  No discharge or congestion Mouth:  Moist, clear oropharynx with no signs of Candida. No ulcers. Full exam including the underside of the tongue was performed. Found no suspicious lesions Neck:  No JVD, no thyromegaly, no masses. Lungs:  Clear to auscultation and percussion bilaterally. Heart: RRR. No MRG. S1, S2 present. Abdomen:  Soft. Nontender or distended. No masses, no HSM. Umbilicus is surgically absent.  Bowel sounds active   Rectal: Did not perform rectal   Musc/Skeltl: No gross joint deformities, erythema or swelling. Extremities:  No CCE.  Neurologic:  Alert. Oriented times 3. No limb weakness or tremor. Skin:  No rashes, no sores, no suspicious lesions. Nodes:  No cervical adenopathy.   Psych:  Pleasant, cooperative, good historian.  Intake/Output from previous day: 06/19 0701 - 06/20 0700 In: -  Out: 1250 [Urine:1250] Intake/Output this shift: No intake/output data recorded.  LAB RESULTS:  Recent Labs  10/12/16 0215 10/13/16 0153  WBC 13.5* 9.3  HGB 14.1 11.8*  HCT 42.5 37.0*  PLT 244 183   BMET Lab Results  Component Value Date   NA 139 10/13/2016   NA 144 10/12/2016   NA 150 (H) 10/12/2016   K 3.0 (L) 10/13/2016   K 3.3 (L) 10/12/2016   K 3.8 10/12/2016   CL 105 10/13/2016   CL 112 (H) 10/12/2016   CL 118 (H) 10/12/2016   CO2 25 10/13/2016   CO2 20 (L) 10/12/2016   CO2 20 (L) 10/12/2016   GLUCOSE 286 (H) 10/13/2016   GLUCOSE 244 (H) 10/12/2016   GLUCOSE 273 (H)  10/12/2016   BUN 16 10/13/2016   BUN 21 (H) 10/12/2016   BUN 27 (H) 10/12/2016   CREATININE 1.16 10/13/2016   CREATININE 1.30 (H) 10/12/2016   CREATININE 1.55 (H) 10/12/2016   CALCIUM 8.6 (L) 10/13/2016   CALCIUM 8.4 (L) 10/12/2016   CALCIUM 9.0 10/12/2016   Lipase     Component Value Date/Time   LIPASE 49 10/10/2016 1325    Drugs of Abuse     Component Value Date/Time   LABOPIA NONE DETECTED 10/10/2016 1435   COCAINSCRNUR NONE DETECTED 10/10/2016 1435   LABBENZ NONE DETECTED 10/10/2016 1435   AMPHETMU NONE DETECTED 10/10/2016 1435   THCU NONE DETECTED 10/10/2016 1435   LABBARB NONE DETECTED 10/10/2016 1435     RADIOLOGY STUDIES: Dg Esophagus  Result Date: 10/13/2016 CLINICAL DATA:  Dysphagia to solids and liquids, progressive x3 months, oral thrush EXAM: ESOPHOGRAM / BARIUM SWALLOW / BARIUM TABLET STUDY TECHNIQUE: Combined double contrast and single contrast examination performed using effervescent crystals, thick barium liquid, and thin barium liquid. The patient was observed with fluoroscopy swallowing a 13 mm barium sulphate tablet. FLUOROSCOPY TIME:  Fluoroscopy Time: 1 minutes 42 seconds of low-dose pulsed fluoroscopy Radiation Exposure Index (if provided by the fluoroscopic device): 27.9 mGy Number of Acquired Spot Images: 9 COMPARISON:  None. FINDINGS: Normal esophageal peristalsis. No fixed esophageal narrowing or stricture. A 13 mm barium tablet passed into the stomach without delay. Mildly irregular/shaggy appearance of the distal esophagus raises the possibility of distal esophagitis, likely on the basis of reflux. Small hiatal hernia. Station could not be assessed for gastroesophageal reflux due to the inability to clear his esophagus in the prone position. IMPRESSION: Irregular appearance of the distal esophagus, suggesting possible (reflex) esophagitis, although equivocal. Consider upper endoscopy for further evaluation as clinically warranted. No fixed esophageal  narrowing or stricture. Small   hiatal hernia. Electronically Signed   By: Sriyesh  Krishnan M.D.   On: 10/13/2016 09:36     IMPRESSION:   *  Difficulty swallowing. The pattern is more suggestive of an oral rather than a esophageal or pharyngeal issue. However on esophagram he does have irregularity to the distal esophagus.  With his out-of-control diabetes he certainly could have some candidate I'll esophagitis versus reflux esophagitis.  *  DKA, improved.  *  OSA, on CPAP.   PLAN:     *  EGD with conscious sedation is scheduled for 11 AM tomorrow. However Dr. Stark may opt to perform the procedure with anesthesia assisted sedation which is not available tomorrow, thus would have to schedule the EGD for Friday, 2 days from now. Another option would be EGD as an outpatient. Patient may need ENT opinion.     Sarah Gribbin  10/14/2016, 1:00 PM Pager: [phone]     Attending physician's note   I have taken a history, examined the patient and reviewed the chart. I agree with the Advanced Practitioner's note, impression and recommendations. Dysphagia, oropharyngeal vs esophageal and abnl barium esophagram. Proceed with EGD/possible dilation tomorrow for further evaluation. If EGD is not diagnostic proceed with MBSS.   Malcolm Stark, MD FACG [phone] Mon-Fri 8a-5p [phone] after 5p, weekends, holidays     

## 2016-10-15 ENCOUNTER — Encounter (HOSPITAL_COMMUNITY): Payer: Self-pay | Admitting: Emergency Medicine

## 2016-10-15 ENCOUNTER — Encounter (HOSPITAL_COMMUNITY)
Admission: EM | Disposition: A | Discharge status: Home / Self Care | Payer: Self-pay | Source: Home / Self Care | Attending: Internal Medicine

## 2016-10-15 DIAGNOSIS — R933 Abnormal findings on diagnostic imaging of other parts of digestive tract: Secondary | ICD-10-CM

## 2016-10-15 DIAGNOSIS — K210 Gastro-esophageal reflux disease with esophagitis: Secondary | ICD-10-CM

## 2016-10-15 DIAGNOSIS — K3189 Other diseases of stomach and duodenum: Secondary | ICD-10-CM

## 2016-10-15 DIAGNOSIS — R1310 Dysphagia, unspecified: Secondary | ICD-10-CM

## 2016-10-15 HISTORY — PX: ESOPHAGOGASTRODUODENOSCOPY: SHX5428

## 2016-10-15 LAB — BASIC METABOLIC PANEL
ANION GAP: 7 (ref 5–15)
BUN: 10 mg/dL (ref 6–20)
CALCIUM: 8.6 mg/dL — AB (ref 8.9–10.3)
CO2: 31 mmol/L (ref 22–32)
CREATININE: 0.98 mg/dL (ref 0.61–1.24)
Chloride: 102 mmol/L (ref 101–111)
GFR calc Af Amer: >60 mL/min (ref >60)
Glucose, Bld: 201 mg/dL — ABNORMAL HIGH (ref 65–99)
Potassium: 3.3 mmol/L — ABNORMAL LOW (ref 3.5–5.1)
SODIUM: 140 mmol/L (ref 135–145)

## 2016-10-15 LAB — CBC WITH DIFFERENTIAL/PLATELET
BASOS ABS: 0 10*3/uL (ref 0.0–0.1)
BASOS PCT: 0 %
EOS ABS: 0.1 10*3/uL (ref 0.0–0.7)
Eosinophils Relative: 1 %
HCT: 35.9 % — ABNORMAL LOW (ref 39.0–52.0)
Hemoglobin: 11.2 g/dL — ABNORMAL LOW (ref 13.0–17.0)
Lymphocytes Relative: 37 %
Lymphs Abs: 2.6 10*3/uL (ref 0.7–4.0)
MCH: 27.1 pg (ref 26.0–34.0)
MCHC: 31.2 g/dL (ref 30.0–36.0)
MCV: 86.7 fL (ref 78.0–100.0)
MONO ABS: 0.5 10*3/uL (ref 0.1–1.0)
MONOS PCT: 7 %
NEUTROS PCT: 55 %
Neutro Abs: 3.8 10*3/uL (ref 1.7–7.7)
Platelets: 168 10*3/uL (ref 150–400)
RBC: 4.14 MIL/uL — ABNORMAL LOW (ref 4.22–5.81)
RDW: 13.6 % (ref 11.5–15.5)
WBC: 7 10*3/uL (ref 4.0–10.5)

## 2016-10-15 LAB — MAGNESIUM: MAGNESIUM: 2.2 mg/dL (ref 1.7–2.4)

## 2016-10-15 LAB — SEDIMENTATION RATE: SED RATE: 25 mm/h — AB (ref 0–16)

## 2016-10-15 LAB — GLUCOSE, CAPILLARY
GLUCOSE-CAPILLARY: 131 mg/dL — AB (ref 65–99)
Glucose-Capillary: 238 mg/dL — ABNORMAL HIGH (ref 65–99)
Glucose-Capillary: 209 mg/dL — ABNORMAL HIGH (ref 65–99)
Glucose-Capillary: 176 mg/dL — ABNORMAL HIGH (ref 65–99)

## 2016-10-15 LAB — C-REACTIVE PROTEIN: CRP: 1.3 mg/dL — ABNORMAL HIGH (ref <1.0)

## 2016-10-15 LAB — TROPONIN I: TROPONIN I: 0.04 ng/mL — AB (ref <0.03)

## 2016-10-15 SURGERY — EGD (ESOPHAGOGASTRODUODENOSCOPY)
Anesthesia: Moderate Sedation

## 2016-10-15 MED ORDER — MIDAZOLAM HCL 10 MG/2ML IJ SOLN
INTRAMUSCULAR | Status: DC | PRN
  Administered 2016-10-15 (×4): 2 mg via INTRAVENOUS

## 2016-10-15 MED ORDER — POTASSIUM CHLORIDE CRYS ER 20 MEQ PO TBCR
40.0000 meq | EXTENDED_RELEASE_TABLET | Freq: Once | ORAL | Status: AC
  Administered 2016-10-15: 40 meq via ORAL
  Filled 2016-10-15: qty 2

## 2016-10-15 MED ORDER — DIPHENHYDRAMINE HCL 50 MG/ML IJ SOLN
INTRAMUSCULAR | Status: DC | PRN
  Administered 2016-10-15: 25 mg via INTRAVENOUS

## 2016-10-15 MED ORDER — FENTANYL CITRATE (PF) 100 MCG/2ML IJ SOLN
INTRAMUSCULAR | Status: AC
  Filled 2016-10-15: qty 2

## 2016-10-15 MED ORDER — MIDAZOLAM HCL 5 MG/ML IJ SOLN
INTRAMUSCULAR | Status: AC
  Filled 2016-10-15: qty 2

## 2016-10-15 MED ORDER — FENTANYL CITRATE (PF) 100 MCG/2ML IJ SOLN
INTRAMUSCULAR | Status: DC | PRN
  Administered 2016-10-15 (×3): 25 ug via INTRAVENOUS

## 2016-10-15 MED ORDER — SUCRALFATE 1 GM/10ML PO SUSP: 1.0000 g | Freq: Three times a day (TID) | ORAL | Status: DC

## 2016-10-15 MED ORDER — FENTANYL CITRATE (PF) 100 MCG/2ML IJ SOLN
INTRAMUSCULAR | Status: DC | PRN
  Administered 2016-10-15: 25 ug via INTRAVENOUS

## 2016-10-15 MED ORDER — DIPHENHYDRAMINE HCL 50 MG/ML IJ SOLN
INTRAMUSCULAR | Status: AC
  Filled 2016-10-15: qty 1

## 2016-10-15 NOTE — Op Note (Addendum)
[facility] Patient Name: Brady Gibbs Procedure Date : 10/15/2016 MRN: [id number] Attending MD: Meryl Dare , MD Date of Birth: 11-16-1980 CSN: [id number] Age: 36 Admit Type: Inpatient Procedure:                Upper GI endoscopy Indications:              Dysphagia, Abnormal barium esophagram Providers:                Venita Lick. Russella Dar, MD, Dwain Sarna, RN, Oletha Blend, Technician Referring MD:             [facility] Medicines:                Fentanyl 100 micrograms IV, Midazolam 8 mg IV,                            Diphenhydramine 25 mg IV Complications:            No immediate complications. Estimated Blood Loss:     Estimated blood loss was minimal. Procedure:                Pre-Anesthesia Assessment:                           - Prior to the procedure, a History and Physical                            was performed, and patient medications and                            allergies were reviewed. The patient's tolerance of                            previous anesthesia was also reviewed. The risks                            and benefits of the procedure and the sedation                            options and risks were discussed with the patient.                            All questions were answered, and informed consent                            was obtained. Prior Anticoagulants: The patient has                            taken no previous anticoagulant or antiplatelet                            agents. ASA Grade Assessment: II - A patient with  mild systemic disease. After reviewing the risks                            and benefits, the patient was deemed in                            satisfactory condition to undergo the procedure.                           After obtaining informed consent, the endoscope was                            passed under direct vision. Throughout the           procedure, the patient's blood pressure, pulse, and                            oxygen saturations were monitored continuously. The                            Endoscope was introduced through the mouth, and                            advanced to the second part of duodenum. The upper                            GI endoscopy was accomplished without difficulty.                            The patient tolerated the procedure fairly well. Scope In: Scope Out: Findings:      LA Grade D (one or more mucosal breaks involving at least 75% of       esophageal circumference) esophagitis with bleeding was found in the       distal esophagus and the mid esophagus. It was severe. Biopsies were       taken with a cold forceps for histology.      The exam of the esophagus was otherwise normal. The proximal esophagus       appeared normal.      A small hiatal hernia was present.      The exam of the stomach was otherwise normal.      Localized mildly erythematous mucosa without active bleeding and with no       stigmata of bleeding was found in the duodenal bulb.      The second portion of the duodenum was normal. Impression:               - LA Grade D erosive esophagitis. Biopsied.                           - Small hiatal hernia.                           - Erythematous duodenopathy.                           - Normal second portion of the duodenum.  Moderate Sedation:      Moderate (conscious) sedation was administered by the endoscopy nurse       and supervised by the endoscopist. The following parameters were       monitored: oxygen saturation, heart rate, blood pressure, respiratory       rate, EKG, adequacy of pulmonary ventilation, and response to care.       Total physician intraservice time was 14 minutes. Recommendation:           - Return patient to hospital ward for ongoing care.                           - Full liquid diet today.                           - Continue present  medications including Protonix                            40 mg po bid, Carafate suspension 1 g ac & hs, GI                            cocktail tid prn.                           - Await pathology results. Procedure Code(s):        --- Professional ---                           [phone], Esophagogastroduodenoscopy, flexible,                            transoral; with biopsy, single or multiple                           99152, Moderate sedation services provided by the                            same physician or other qualified health care                            professional performing the diagnostic or                            therapeutic service that the sedation supports,                            requiring the presence of an independent trained                            observer to assist in the monitoring of the                            patient's level of consciousness and physiological                            status; initial 15 minutes of intraservice time,  patient age 38 years or older Diagnosis Code(s):        --- Professional ---                           K20.8, Other esophagitis                           K44.9, Diaphragmatic hernia without obstruction or                            gangrene                           K31.89, Other diseases of stomach and duodenum                           R13.10, Dysphagia, unspecified                           R93.3, Abnormal findings on diagnostic imaging of                            other parts of digestive tract CPT copyright 2016 [employer]. All rights reserved. The codes documented in this report are preliminary and upon coder review may  be revised to meet current compliance requirements. Meryl Dare, MD 10/15/2016 11:35:07 AM This report has been signed electronically. Number of Addenda: 0

## 2016-10-15 NOTE — Procedures (Signed)
CPAP is bedside and ready for use.  Patient said he will place on himself when ready for bed. 

## 2016-10-15 NOTE — Interval H&P Note (Signed)
History and Physical Interval Note:  10/15/2016 10:45 AM  Brady Gibbs  has presented today for surgery, with the diagnosis of esophagitis, dysphagia  The various methods of treatment have been discussed with the patient and family. After consideration of risks, benefits and other options for treatment, the patient has consented to  Procedure(s): ESOPHAGOGASTRODUODENOSCOPY (EGD) (N/A) SAVORY DILATION (N/A) as a surgical intervention .  The patient's history has been reviewed, patient examined, no change in status, stable for surgery.  I have reviewed the patient's chart and labs.  Questions were answered to the patient's satisfaction.     Venita LickMalcolm T. Russella DarStark

## 2016-10-15 NOTE — H&P (View-Only) (Signed)
[facility] Consult: 1:00 PM 10/14/2016  LOS: 4 days    Referring Provider: Dr Allena KatzPatel  Primary Care Physician:  Patient, No Pcp Per was pt of Dr Mikey [facility]ussingHoffman at [facility] but stopped coming to appts in 2017.    Primary Gastroenterologist:  unassigned    Reason for Consultation:  Dysphagia.     HPI: Brady Gibbs is a 36 y.o. male.  PMH IDDM.  Obesity, BMI 37.  OSA on CPAP.  Depression, anxiety. Oral candidiasis 12/2014.  GERD and minor CGE 2016, Protonix added 2016.  HLD. Non-adherence.  DKA, AKI 12/2014 (Hgb A1 c 14). Likely CKD but does not have recent labs.  Umbilical hernia repair as toddler.  Admitted with DKA, dehydration.  Had not taken insulin for several days and developed N/V, mild abdominal discomfort.  Hgb A1c 15.5 (avg glucose 398 over 3 months)  Dysphagia dates to at least 12/2014.    Admitted 6/16 with DKA, glucose 1,300.  AKI.  All improving.   With hydration Hgb has gone from 14.9 to 11.8.  MCV normal.  Leukocytosis resolved.  Gastric occult blood +.   Esophagram 10/13/16:  Irregular appearance of the distal esophagus, suggesting possible (reflex) esophagitis, although equivocal. Consider upper endoscopy for further evaluation. No fixed esophageal narrowing or stricture.  Small hiatal hernia.  Patient describes an odd pattern of difficulty swallowing that dates back several years. He will chew his food but he has trouble getting the food to the back of his mouth and actually swallowing it. Once he swallows it it does not get stuck. He feels food accumulating underneath his tongue. He points to the underside of his jaw as the region of difficulty swallowing.   Patient previously took omeprazole religiously but after losing his job at [employer] along with his healthcare insurance he  stopped taking the omeprazole. He does not get frequent heartburn. He's never undergone upper endoscopy. The coffee ground emesis which was occurring for a few days prior to this current admission was new and it has resolved with correction of his DKA. Stools are consistently brown, usually every day and never bloody.  Patient doesn't use NSAIDs, rarely drinks beer.    Past Medical History:  Diagnosis Date  . Diabetes mellitus without complication (HCC)   . Sleep apnea     Past Surgical History:  Procedure Laterality Date  . FOOT SURGERY Right  at age 36   cyst removal  . HERNIA REPAIR     umbical hernia repair at 29 or 36 years old.    Prior to Admission medications   Medication Sig Start Date End Date Taking? Authorizing Provider  atorvastatin (LIPITOR) 20 MG tablet Take 1 tablet (20 mg total) by mouth daily at 6 PM. Patient not taking: Reported on 01/01/2016 01/03/15   Valentino NoseBoswell, Nathan, MD  insulin NPH-regular Human (NOVOLIN 70/30) (70-30) 100 UNIT/ML injection Inject 30 units under the skin with food in the morning. Inject 15 units under the skin before bed. Patient not taking: Reported on 01/01/2016 01/03/15   Valentino NoseBoswell, Nathan, MD  metFORMIN (GLUCOPHAGE XR) 500 MG 24 hr tablet Take 1 tablet (500 mg total) by mouth daily with breakfast. Increase to twice a day after 1 week. Patient not taking: Reported on 01/01/2016 01/03/15   Valentino Nose, MD  pantoprazole (PROTONIX) 40 MG tablet Take 1 tablet (40 mg total) by mouth daily. Patient not taking: Reported on 01/01/2016 08/09/14 08/17/15  Gust Rung, DO  podofilox (CONDYLOX) 0.5 % gel Apply topically 2 (two) times daily. Apply for 3 consecutive days followed by 4 days without treatment. Repeat as needed for up to 4 weeks. Patient not taking: Reported on 01/01/2016 08/09/14   Gust Rung, DO    Scheduled Meds: . atorvastatin  20 mg Oral q1800  . DULoxetine  30 mg Oral Daily  . gabapentin  300 mg Oral QHS  . gi cocktail  30 mL Oral Once  .  heparin subcutaneous  5,000 Units Subcutaneous Q8H  . insulin aspart  0-15 Units Subcutaneous TID WC  . insulin aspart  0-5 Units Subcutaneous QHS  . insulin aspart protamine- aspart  20 Units Subcutaneous Q breakfast  . insulin aspart protamine- aspart  30 Units Subcutaneous Q supper  . pantoprazole  40 mg Oral BID AC  . polyethylene glycol  17 g Oral Daily  . sodium chloride flush  3 mL Intravenous Q12H  . sucralfate  1 g Oral TID WC & HS   Infusions:  PRN Meds: acetaminophen **OR** acetaminophen, dextrose, gi cocktail, nitroGLYCERIN, ondansetron (ZOFRAN) IV   Allergies as of 10/10/2016  . (No Known Allergies)    History reviewed. No pertinent family history.  Social History   Social History  . Marital status: Married    Spouse name: N/A  . Number of children: N/A  . Years of education: 10   Occupational History  . Not on file.   Social History Main Topics  . Smoking status: Current Every Day Smoker    Packs/day: 0.50    Types: Cigarettes  . Smokeless tobacco: Never Used  . Alcohol use No  . Drug use: No  . Sexual activity: Not on file   Other Topics Concern  . Not on file   Social History Narrative  . No narrative on file    REVIEW OF SYSTEMS: Constitutional:  With recent illness was experiencing weakness, fatigue. Generally has good energy level ENT:  No nose bleeds Pulm:  No trouble breathing. No hemoptysis. CV:  No palpitations, no LE edema.  GU:  No hematuria, no frequency GI:  Per HPI Heme:  No unusual bleeding or excessive bruising.   Transfusions:  Does not recall previous transfusions. Neuro:  No headaches, no peripheral tingling or numbness Derm:  No itching, no rash or sores.  Endocrine:  Polydipsia, polyuria which has improved with correction of DKA. Immunization:  Not inquired as to recent or previous immunizations. Travel:  None beyond local counties in last few months.    PHYSICAL EXAM: Vital signs in last 24 hours: Vitals:    10/13/16 2026 10/14/16 0639  BP: 126/72 117/64  Pulse: (!) 102 79  Resp: 17 18  Temp: 98.5 F (36.9 C) 98.1 F (36.7 C)   Wt Readings from Last 3 Encounters:  10/10/16 127 kg (279 lb 15.8 oz)  01/01/15 127 kg (279 lb 15.8 oz)  09/12/14 (!) 139 kg (306 lb 8 oz)    General: Moderately obese, pleasant, comfortable. Does not look ill. Head:  No facial asymmetry or swelling. No signs of head trauma.  Eyes:  No scleral icterus, no conjunctival pallor. Ears:  Not hard of hearing  Nose:  No discharge or congestion Mouth:  Moist, clear oropharynx with no signs of Candida. No ulcers. Full exam including the underside of the tongue was performed. Found no suspicious lesions Neck:  No JVD, no thyromegaly, no masses. Lungs:  Clear to auscultation and percussion bilaterally. Heart: RRR. No MRG. S1, S2 present. Abdomen:  Soft. Nontender or distended. No masses, no HSM. Umbilicus is surgically absent.  Bowel sounds active   Rectal: Did not perform rectal   Musc/Skeltl: No gross joint deformities, erythema or swelling. Extremities:  No CCE.  Neurologic:  Alert. Oriented times 3. No limb weakness or tremor. Skin:  No rashes, no sores, no suspicious lesions. Nodes:  No cervical adenopathy.   Psych:  Pleasant, cooperative, good historian.  Intake/Output from previous day: 06/19 0701 - 06/20 0700 In: -  Out: 1250 [Urine:1250] Intake/Output this shift: No intake/output data recorded.  LAB RESULTS:  Recent Labs  10/12/16 0215 10/13/16 0153  WBC 13.5* 9.3  HGB 14.1 11.8*  HCT 42.5 37.0*  PLT 244 183   BMET Lab Results  Component Value Date   NA 139 10/13/2016   NA 144 10/12/2016   NA 150 (H) 10/12/2016   K 3.0 (L) 10/13/2016   K 3.3 (L) 10/12/2016   K 3.8 10/12/2016   CL 105 10/13/2016   CL 112 (H) 10/12/2016   CL 118 (H) 10/12/2016   CO2 25 10/13/2016   CO2 20 (L) 10/12/2016   CO2 20 (L) 10/12/2016   GLUCOSE 286 (H) 10/13/2016   GLUCOSE 244 (H) 10/12/2016   GLUCOSE 273 (H)  10/12/2016   BUN 16 10/13/2016   BUN 21 (H) 10/12/2016   BUN 27 (H) 10/12/2016   CREATININE 1.16 10/13/2016   CREATININE 1.30 (H) 10/12/2016   CREATININE 1.55 (H) 10/12/2016   CALCIUM 8.6 (L) 10/13/2016   CALCIUM 8.4 (L) 10/12/2016   CALCIUM 9.0 10/12/2016   Lipase     Component Value Date/Time   LIPASE 49 10/10/2016 1325    Drugs of Abuse     Component Value Date/Time   LABOPIA NONE DETECTED 10/10/2016 1435   COCAINSCRNUR NONE DETECTED 10/10/2016 1435   LABBENZ NONE DETECTED 10/10/2016 1435   AMPHETMU NONE DETECTED 10/10/2016 1435   THCU NONE DETECTED 10/10/2016 1435   LABBARB NONE DETECTED 10/10/2016 1435     RADIOLOGY STUDIES: Dg Esophagus  Result Date: 10/13/2016 CLINICAL DATA:  Dysphagia to solids and liquids, progressive x3 months, oral thrush EXAM: ESOPHOGRAM / BARIUM SWALLOW / BARIUM TABLET STUDY TECHNIQUE: Combined double contrast and single contrast examination performed using effervescent crystals, thick barium liquid, and thin barium liquid. The patient was observed with fluoroscopy swallowing a 13 mm barium sulphate tablet. FLUOROSCOPY TIME:  Fluoroscopy Time: 1 minutes 42 seconds of low-dose pulsed fluoroscopy Radiation Exposure Index (if provided by the fluoroscopic device): 27.9 mGy Number of Acquired Spot Images: 9 COMPARISON:  None. FINDINGS: Normal esophageal peristalsis. No fixed esophageal narrowing or stricture. A 13 mm barium tablet passed into the stomach without delay. Mildly irregular/shaggy appearance of the distal esophagus raises the possibility of distal esophagitis, likely on the basis of reflux. Small hiatal hernia. Station could not be assessed for gastroesophageal reflux due to the inability to clear his esophagus in the prone position. IMPRESSION: Irregular appearance of the distal esophagus, suggesting possible (reflex) esophagitis, although equivocal. Consider upper endoscopy for further evaluation as clinically warranted. No fixed esophageal  narrowing or stricture. Small  hiatal hernia. Electronically Signed   By: Charline Bills M.D.   On: 10/13/2016 09:36     IMPRESSION:   *  Difficulty swallowing. The pattern is more suggestive of an oral rather than a esophageal or pharyngeal issue. However on esophagram he does have irregularity to the distal esophagus.  With his out-of-control diabetes he certainly could have some candidate I'll esophagitis versus reflux esophagitis.  *  DKA, improved.  *  OSA, on CPAP.   PLAN:     *  EGD with conscious sedation is scheduled for 11 AM tomorrow. However Dr. Russella Dar may opt to perform the procedure with anesthesia assisted sedation which is not available tomorrow, thus would have to schedule the EGD for Friday, 2 days from now. Another option would be EGD as an outpatient. Patient may need ENT opinion.     Jennye Moccasin  10/14/2016, 1:00 PM Pager: [phone]     Attending physician's note   I have taken a history, examined the patient and reviewed the chart. I agree with the Advanced Practitioner's note, impression and recommendations. Dysphagia, oropharyngeal vs esophageal and abnl barium esophagram. Proceed with EGD/possible dilation tomorrow for further evaluation. If EGD is not diagnostic proceed with MBSS.   Claudette Head, MD Clementeen Graham [phone] Mon-Fri 8a-5p [phone] after 5p, weekends, holidays

## 2016-10-15 NOTE — Progress Notes (Signed)
[facility] Progress Note  Patient: Brady Gibbs FMB:[id number]   PCP: Patient, No Pcp Per DOB: May 03, 1980   DOA: 10/10/2016   DOS: 10/15/2016   Date of Service: the patient was seen and examined on 10/15/2016  Subjective: No chest pain. Occasional disease difficulty swallowing this morning. No nausea no vomiting.  Brief hospital course: Pt. with PMH of diabetes, obesity, OSA on CPAP, depression; admitted on 10/10/2016, presented with complaint of nausea and vomiting, was found to have DKA with dehydration. Patient had nausea and vomiting actually present for Lasix 100 also found to have severe reflux esophagitis. GI consulted. Currently further plan is follow-up on GI recommendation.  Assessment and Plan: 1. DKA Due to poor compliance as the patient stopped taking all his medication including insulin recently. Currently insulin dose has been increased for long-acting. Continue sliding scale. Appreciate diabetes coordinator intervention. Carb modified diet.  2. Dysphagia for solid. Severe reflux esophagitis. GI consulted. Continue PPI, Carafate, GI cocktail when necessary. EKG shows esophagitis, currently on full liquid diet.  3. Acute kidney injury. Due to dehydration. Creatinine significantly elevated on admission 3.2. Currently resolved and back to normal baseline. Monitor.  4. Chest pain. Abnormal EKG. Elevated troponin. Patient initially had elevated troponin on admission which is actually secondary to poor clearance from acute kidney injury. Patient did have complaints of some chest pain today EKG was showing diffuse ST elevation although not consistent with an ACS or systemic picture. Discussed with cardiology on call. Troponin negative as well. Symptoms classical for GI chest pain. Due to the presence of ST changes as well as concern for pericarditis ESR was checked as well as CRP currently normal. Echocardiogram pending  5. Hyponatremia. Currently stopped on  resolved. We'll monitor.  6. Metabolic encephalopathy. Obstructive sleep apnea Currently resolved. Noncompliant with CPAP.  Monitor.  7. History of depression and anxiety. Hallucination as well as noncompliance with medical regimen. Next and psychiatry consulted. Recommended to start Cymbalta and gabapentin. No indication for inpatient admission.  Diet: Carb modified diet DVT Prophylaxis: subcutaneous Heparin  Advance goals of care discussion: Full code  Family Communication: no family was present at bedside, at the time of interview.   Disposition:  Discharge to home.  Consultants:gastroenterology, psychiatry Procedures: none  Antibiotics: Anti-infectives    None       Objective: Physical Exam: Vitals:   10/15/16 1145 10/15/16 1150 10/15/16 1155 10/15/16 1214  BP:  (!) 100/49 (!) 104/52 111/68  Pulse: 92 95 94 84  Resp: '14 15 15 16  '$ Temp:      TempSrc:      SpO2: 99% 95% 95% 96%  Weight:      Height:       No intake or output data in the 24 hours ending 10/15/16 1725 Filed Weights   10/10/16 2020 10/15/16 1010  Weight: 127 kg (279 lb 15.8 oz) 110.2 kg (243 lb)   General: Alert, Awake and Oriented to Time, Place and Person. Appear in mild distress, affect appropriate Eyes: PERRL, Conjunctiva normal ENT: Oral Mucosa clear moist. Neck: no JVD, no Abnormal Mass Or lumps Cardiovascular: S1 and S2 Present, no Murmur, Peripheral Pulses Present Respiratory: normal respiratory effort, Bilateral Air entry equal and Decreased, no use of accessory muscle, Clear to Auscultation, no Crackles, no wheezes Abdomen: Bowel Sound present, Soft and no tenderness, no hernia Skin: no redness, no Rash, no induration Extremities: no Pedal edema, no calf tenderness Neurologic: Grossly no focal neuro deficit. Bilaterally Equal motor strength Data Reviewed: CBC:  Recent Labs Lab 10/10/16 1325 10/11/16 0521 10/12/16 0215 10/13/16 0153 10/14/16 2351  WBC 18.6* 13.8* 13.5* 9.3  7.0  NEUTROABS  --   --   --   --  3.8  HGB 14.9 14.5 14.1 11.8* 11.2*  HCT 50.6 44.5 42.5 37.0* 35.9*  MCV 95.5 84.4 83.5 85.8 86.7  PLT 246 240 244 183 263   Basic Metabolic Panel:  Recent Labs Lab 10/11/16 2106 10/12/16 0215 10/12/16 1459 10/13/16 0153 10/14/16 2351  NA 146* 150* 144 139 140  K 4.5 3.8 3.3* 3.0* 3.3*  CL 115* 118* 112* 105 102  CO2 16* 20* 20* 25 31  GLUCOSE 342* 273* 244* 286* 201*  BUN 30* 27* 21* 16 10  CREATININE 1.70* 1.55* 1.30* 1.16 0.98  CALCIUM 9.3 9.0 8.4* 8.6* 8.6*  MG  --   --   --   --  2.2  PHOS 2.0*  --   --   --   --     Liver Function Tests:  Recent Labs Lab 10/10/16 1325  AST 19  ALT 17  ALKPHOS 85  BILITOT 2.3*  PROT 6.6  ALBUMIN 3.7    Recent Labs Lab 10/10/16 1325  LIPASE 49   No results for input(s): AMMONIA in the last 168 hours. Coagulation Profile: No results for input(s): INR, PROTIME in the last 168 hours. Cardiac Enzymes:  Recent Labs Lab 10/10/16 2329 10/11/16 0521 10/14/16 1228 10/14/16 1824 10/14/16 2351  TROPONINI 0.03* 0.03* <0.03 <0.03 0.04*   BNP (last 3 results) No results for input(s): PROBNP in the last 8760 hours. CBG:  Recent Labs Lab 10/14/16 1240 10/14/16 1713 10/14/16 2124 10/15/16 0749 10/15/16 1213  GLUCAP 246* 245* 165* 238* 209*   Studies: No results found.  Scheduled Meds: . atorvastatin  20 mg Oral q1800  . DULoxetine  30 mg Oral Daily  . gabapentin  300 mg Oral QHS  . insulin aspart  0-15 Units Subcutaneous TID WC  . insulin aspart  0-5 Units Subcutaneous QHS  . insulin aspart protamine- aspart  30 Units Subcutaneous BID WC  . pantoprazole  40 mg Oral BID AC  . sodium chloride flush  3 mL Intravenous Q12H  . sucralfate  1 g Oral TID WC & HS   Continuous Infusions: PRN Meds: acetaminophen **OR** acetaminophen, dextrose, gi cocktail, nitroGLYCERIN, ondansetron (ZOFRAN) IV  Time spent: 30 minutes  Author: Berle Mull, MD [facility] Pager:  [phone] 10/15/2016 5:25 PM  If 7PM-7AM, please contact night-coverage at [email or web address], password [facility]

## 2016-10-15 NOTE — Progress Notes (Signed)
CSW spoke with patient's mother. She reported that patient cannot come back to stay with her again because it is affecting her health. CSW stated that CSW could offer a shelter list to patient. Patient's mother stated agreement and asked CSW to provide patient with a Medicaid application since patient has been denied several times before. CSW to see patient tomorrow.   Osborne Cascoadia Arianny Pun LCSWA [phone]

## 2016-10-15 NOTE — Progress Notes (Signed)
Inpatient Diabetes Program Recommendations  AACE/ADA: New Consensus Statement on Inpatient Glycemic Control (2015)  Target Ranges:  Prepandial:   less than 140 mg/dL      Peak postprandial:   less than 180 mg/dL (1-2 hours)      Critically ill patients:  140 - 180 mg/dL   Lab Results  Component Value Date   GLUCAP 238 (H) 10/15/2016   HGBA1C >15.5 (H) 10/10/2016    Review of Glycemic Control  Results for [name], Brady (MRN [id number]) as of 10/15/2016 08:46  Ref. Range 10/14/2016 07:59 10/14/2016 12:40 10/14/2016 17:13 10/14/2016 21:24 10/15/2016 07:49  Glucose-Capillary Latest Ref Range: 65 - 99 mg/dL 409249 (H) 811246 (H) 914245 (H) 165 (H) 238 (H)   Current orders for Inpatient glycemic control: 70/30  30 units bid, Novolog 0-15 units TID with meals, Novolog 0-5 units QHS  Inpatient Diabetes Program Recommendations:   Consider increasing pm 70/30 insulin to 35 units (fasting blood sugar remains high).   A1C 15.5% on 10/10/16 indicating an average glucose of 398 mg/dl over the past 2-3 months. Patient needs to follow up with PCP regarding DM control.   Susette RacerJulie Samyah Bilbo, RN, BA, MHA, CDE Diabetes Coordinator Inpatient Diabetes Program  [phone] (Team Pager) [phone] [facility] Office) 10/15/2016 8:50 AM  \

## 2016-10-16 ENCOUNTER — Inpatient Hospital Stay (HOSPITAL_COMMUNITY): Payer: Self-pay

## 2016-10-16 ENCOUNTER — Encounter (HOSPITAL_COMMUNITY): Payer: Self-pay | Admitting: Gastroenterology

## 2016-10-16 DIAGNOSIS — R079 Chest pain, unspecified: Secondary | ICD-10-CM

## 2016-10-16 LAB — BASIC METABOLIC PANEL
Anion gap: 6 (ref 5–15)
BUN: 8 mg/dL (ref 6–20)
CHLORIDE: 103 mmol/L (ref 101–111)
CO2: 30 mmol/L (ref 22–32)
CREATININE: 0.96 mg/dL (ref 0.61–1.24)
Calcium: 8.3 mg/dL — ABNORMAL LOW (ref 8.9–10.3)
GFR calc non Af Amer: >60 mL/min (ref >60)
Glucose, Bld: 185 mg/dL — ABNORMAL HIGH (ref 65–99)
POTASSIUM: 3.2 mmol/L — AB (ref 3.5–5.1)
Sodium: 139 mmol/L (ref 135–145)

## 2016-10-16 LAB — ECHOCARDIOGRAM COMPLETE
E decel time: 275 msec
EERAT: 5.38
FS: 38 % (ref 28–44)
Height: 73 in
IVS/LV PW RATIO, ED: 0.8
LA ID, A-P, ES: 39 mm
LA diam end sys: 39 mm
LA vol A4C: 52.7 ml
LA vol index: 26.4 mL/m2
LADIAMINDEX: 1.62 cm/m2
LAVOL: 63.6 mL
LDCA: 3.46 cm2
LV TDI E'MEDIAL: 8.16
LVEEAVG: 5.38
LVEEMED: 5.38
LVELAT: 15.7 cm/s
LVOT VTI: 26 cm
LVOT diameter: 21 mm
LVOT peak grad rest: 10 mmHg
LVOT peak vel: 161 cm/s
LVOTSV: 90 mL
MV Dec: 275
MV pk E vel: 84.4 m/s
MVPG: 3 mmHg
MVPKAVEL: 87.3 m/s
PV Reg vel dias: 94.5 cm/s
PW: 16.7 mm — AB (ref 0.6–1.1)
RV LATERAL S' VELOCITY: 20.7 cm/s
RV TAPSE: 27.7 mm
TDI e' lateral: 15.7
Weight: 3888 oz

## 2016-10-16 LAB — GLUCOSE, CAPILLARY
GLUCOSE-CAPILLARY: 165 mg/dL — AB (ref 65–99)
Glucose-Capillary: 186 mg/dL — ABNORMAL HIGH (ref 65–99)
Glucose-Capillary: 124 mg/dL — ABNORMAL HIGH (ref 65–99)
Glucose-Capillary: 152 mg/dL — ABNORMAL HIGH (ref 65–99)

## 2016-10-16 MED ORDER — INSULIN ASPART PROT & ASPART (70-30 MIX) 100 UNIT/ML ~~LOC~~ SUSP: 35.0000 [IU] | Freq: Two times a day (BID) | SUBCUTANEOUS | 0 refills | Status: DC

## 2016-10-16 MED ORDER — GABAPENTIN 300 MG PO CAPS: 300.0000 mg | ORAL_CAPSULE | Freq: Every day | ORAL | 0 refills | Status: DC

## 2016-10-16 MED ORDER — OMEPRAZOLE 40 MG PO CPDR: 40.0000 mg | DELAYED_RELEASE_CAPSULE | Freq: Two times a day (BID) | ORAL | 0 refills | Status: DC

## 2016-10-16 MED ORDER — SUCRALFATE 1 GM/10ML PO SUSP: 1.0000 g | Freq: Three times a day (TID) | ORAL | 0 refills | Status: DC

## 2016-10-16 MED ORDER — PANTOPRAZOLE SODIUM 40 MG PO TBEC: 40.0000 mg | DELAYED_RELEASE_TABLET | Freq: Two times a day (BID) | ORAL | 0 refills | Status: DC

## 2016-10-16 MED ORDER — DULOXETINE HCL 30 MG PO CPEP: 30.0000 mg | ORAL_CAPSULE | Freq: Every day | ORAL | 0 refills | Status: DC

## 2016-10-16 MED ORDER — IOPAMIDOL (ISOVUE-370) INJECTION 76%
INTRAVENOUS | Status: AC
  Administered 2016-10-16: 100 mL
  Filled 2016-10-16: qty 100

## 2016-10-16 MED ORDER — METFORMIN HCL ER 500 MG PO TB24: 500.0000 mg | ORAL_TABLET | Freq: Every day | ORAL | 0 refills | Status: DC

## 2016-10-16 MED ORDER — "INSULIN SYRINGE 31G X 5/16"" 0.3 ML MISC": 1.0000 | Freq: Two times a day (BID) | 0 refills | Status: DC

## 2016-10-16 MED ORDER — BLOOD GLUCOSE METER KIT: PACK | 0 refills | Status: DC

## 2016-10-16 MED ORDER — "INSULIN SYRINGE 31G X 5/16"" 0.5 ML MISC": 1.0000 | Freq: Two times a day (BID) | 0 refills | Status: DC

## 2016-10-16 NOTE — Procedures (Signed)
Placed patient on CPAP for the night.  Patient is tolerating well at this time. 

## 2016-10-16 NOTE — Progress Notes (Signed)
          Daily Rounding Note  10/16/2016, 10:08 AM  LOS: 6 days   SUBJECTIVE:   Chief complaint: dysphagia.  Too soon to say if it is better    Undergoing echocardiogram currently  OBJECTIVE:         Vital signs in last 24 hours:    Temp:  [98.1 F (36.7 C)-98.9 F (37.2 C)] 98.9 F (37.2 C) (06/22 0505) Pulse Rate:  [80-133] 80 (06/22 0505) Resp:  [11-26] 18 (06/22 0505) BP: (100-142)/(49-73) 119/67 (06/22 0505) SpO2:  [95 %-100 %] 99 % (06/22 0505) Weight:  [110.2 kg (243 lb)] 110.2 kg (243 lb) (06/21 1010) Last BM Date: 10/14/16   Filed Weights   10/10/16 2020 10/15/16 1010  Weight: 127 kg (279 lb 15.8 oz) 110.2 kg (243 lb)   General: comfortable.  NAd   Heart: RRR Chest: no labored breathing Abdomen: not examined as echo in progress  Extremities: no CCE Neuro/Psych:  Appropriate.  Fully alert.    Lab Results:  Recent Labs  10/14/16 2351  WBC 7.0  HGB 11.2*  HCT 35.9*  PLT 168   BMET  Recent Labs  10/14/16 2351 10/16/16 0422  NA 140 139  K 3.3* 3.2*  CL 102 103  CO2 31 30  GLUCOSE 201* 185*  BUN 10 8  CREATININE 0.98 0.96  CALCIUM 8.6* 8.3*    ASSESMENT:   *  Dysphagia.  EGD 6/21: severe, grade D, erosive esophagitis.  Small HH.  Duodenal bulb erythema.   EGD findings explain abnormal distal esophageal appearance on esophagram.   Path showed ulcerations, immunochemistry for HSV and CMV pending.  PLAN   *  BID PO Protonix (for 1 month), Carafate AC/HS for now (2 weeks) Ultimately should stay on chronic once daily PPI.   Discussed with pt  *  At discharge would Rx generic omeprozole 40 mg BID, as suspect this will be least costly PPI for him.    *  Follow up esophageal biopsies.    Jennye MoccasinSarah Gribbin  10/16/2016, 10:08 AM Pager: [phone]     Attending physician's note   I have taken an interval history, reviewed the chart and examined the patient. I agree with the Advanced  Practitioner's note, impression and recommendations.   Severe Grade D esophagitis likely secondary to reflux.  Awaiting pathology report addendum to definitively R/O HSV and CMV. If either positive consult ID for treatment recommendations. Will follow up on esophageal path addendum. PPI po bid long term for mgmt. Could take days or weeks for dysphagia to resolve and he will do better with a full liquid to soft diet.  GI cocktail TID ac for a couple weeks will help with symptoms. GI available if needed.    Claudette HeadMalcolm Kylil Swopes, MD Clementeen GrahamFACG [phone] Mon-Fri 8a-5p [phone] after 5p, weekends, holidays

## 2016-10-16 NOTE — Progress Notes (Addendum)
CSW and RNCM spoke with patient regarding his discharge plan. Patient reported that he is going to see if [place]. CSW offered homeless resources and patient accepted them. CSW also provided patient with a Medicaid application.  CSW signing off.  Osborne Cascoadia Aniqua Briere LCSWA [phone]

## 2016-10-16 NOTE — Progress Notes (Signed)
  Echocardiogram 2D Echocardiogram has been performed.  Brady Gibbs 10/16/2016, 11:39 AM

## 2016-10-16 NOTE — Progress Notes (Signed)
[facility] Progress Note  Patient: Brady Gibbs YSA:[id number]   PCP: Patient, No Pcp Per DOB: 09/20/1980   DOA: 10/10/2016   DOS: 10/16/2016   Date of Service: the patient was seen and examined on 10/16/2016  Subjective: Continues to have dysphagia otherwise no other acute complaint.  Brief hospital course: Pt. with PMH of diabetes, obesity, OSA on CPAP, depression; admitted on 10/10/2016, presented with complaint of nausea and vomiting, was found to have DKA with dehydration. Patient had nausea and vomiting actually present for Lasix 100 also found to have severe reflux esophagitis. GI consulted. Currently further plan is monitor on telemetry overnight.  Assessment and Plan: 1. DKA Due to poor compliance as the patient stopped taking all his medication including insulin recently. Currently insulin dose has been increased for long-acting. Continue sliding scale. Appreciate diabetes coordinator intervention. Carb modified diet.  2. Dysphagia for solid. Severe reflux esophagitis. GI consulted. Continue PPI, Carafate, GI cocktail when necessary. EGD shows esophagitis, currently on carb modified diet  3. Acute kidney injury. Due to dehydration. Creatinine significantly elevated on admission 3.2. Currently resolved and back to normal baseline. Monitor.  4. Chest pain. Abnormal EKG. Elevated troponin. Patient initially had elevated troponin on admission which is actually secondary to poor clearance from acute kidney injury. Patient did have complaints of some chest pain today EKG was showing diffuse ST elevation although not consistent with an ACS or systemic picture. Discussed with cardiology on call. Troponin negative as well. Symptoms classical for GI chest pain. Due to the presence of ST changes as well as concern for pericarditis ESR was checked as well as CRP currently normal. Echocardiogram shows that the patient has a mobile right atrial mass considered for a thrombosis or  infective endocarditis versus something benign associated with Chiari malformation. I discussed with cardiology, they want me to rule out PE, CT chest PE protocol is negative for PE. Cardiology will arrange for outpatient TEE for the patient.  5. Hyponatremia. Currently stopped on resolved. We'll monitor.  6. Metabolic encephalopathy. Obstructive sleep apnea Currently resolved. Noncompliant with CPAP.  Monitor.  7. History of depression and anxiety. Hallucination as well as noncompliance with medical regimen. Next and psychiatry consulted. Recommended to start Cymbalta and gabapentin. No indication for inpatient admission.  Diet: Carb modified diet DVT Prophylaxis: subcutaneous Heparin  Advance goals of care discussion: Full code  Family Communication: no family was present at bedside, at the time of interview.   Disposition:  Discharge to home.  Consultants:gastroenterology, psychiatry Procedures: Echocardiogram  Antibiotics: Anti-infectives    None       Objective: Physical Exam: Vitals:   10/15/16 1214 10/15/16 2215 10/16/16 0505 10/16/16 1518  BP: 111/68 108/62 119/67 121/73  Pulse: 84 88 80 80  Resp: '16 18 18 16  '$ Temp:  98.6 F (37 C) 98.9 F (37.2 C) 98.4 F (36.9 C)  TempSrc:    Oral  SpO2: 96% 98% 99% 97%  Weight:      Height:        Intake/Output Summary (Last 24 hours) at 10/16/16 1941 Last data filed at 10/16/16 1520  Gross per 24 hour  Intake              480 ml  Output                0 ml  Net              480 ml   Filed Weights   10/10/16 2020 10/15/16 1010  Weight: 127 kg (279 lb 15.8 oz) 110.2 kg (243 lb)   General: Alert, Awake and Oriented to Time, Place and Person. Appear in mild distress, affect appropriate Eyes: PERRL, Conjunctiva normal ENT: Oral Mucosa clear moist. Neck: no JVD, no Abnormal Mass Or lumps Cardiovascular: S1 and S2 Present, no Murmur, Peripheral Pulses Present Respiratory: normal respiratory effort, Bilateral  Air entry equal and Decreased, no use of accessory muscle, Clear to Auscultation, no Crackles, no wheezes Abdomen: Bowel Sound present, Soft and no tenderness, no hernia Skin: no redness, no Rash, no induration Extremities: no Pedal edema, no calf tenderness Neurologic: Grossly no focal neuro deficit. Bilaterally Equal motor strength Data Reviewed: CBC:  Recent Labs Lab 10/10/16 1325 10/11/16 0521 10/12/16 0215 10/13/16 0153 10/14/16 2351  WBC 18.6* 13.8* 13.5* 9.3 7.0  NEUTROABS  --   --   --   --  3.8  HGB 14.9 14.5 14.1 11.8* 11.2*  HCT 50.6 44.5 42.5 37.0* 35.9*  MCV 95.5 84.4 83.5 85.8 86.7  PLT 246 240 244 183 951   Basic Metabolic Panel:  Recent Labs Lab 10/11/16 2106 10/12/16 0215 10/12/16 1459 10/13/16 0153 10/14/16 2351 10/16/16 0422  NA 146* 150* 144 139 140 139  K 4.5 3.8 3.3* 3.0* 3.3* 3.2*  CL 115* 118* 112* 105 102 103  CO2 16* 20* 20* '25 31 30  '$ GLUCOSE 342* 273* 244* 286* 201* 185*  BUN 30* 27* 21* '16 10 8  '$ CREATININE 1.70* 1.55* 1.30* 1.16 0.98 0.96  CALCIUM 9.3 9.0 8.4* 8.6* 8.6* 8.3*  MG  --   --   --   --  2.2  --   PHOS 2.0*  --   --   --   --   --     Liver Function Tests:  Recent Labs Lab 10/10/16 1325  AST 19  ALT 17  ALKPHOS 85  BILITOT 2.3*  PROT 6.6  ALBUMIN 3.7    Recent Labs Lab 10/10/16 1325  LIPASE 49   No results for input(s): AMMONIA in the last 168 hours. Coagulation Profile: No results for input(s): INR, PROTIME in the last 168 hours. Cardiac Enzymes:  Recent Labs Lab 10/10/16 2329 10/11/16 0521 10/14/16 1228 10/14/16 1824 10/14/16 2351  TROPONINI 0.03* 0.03* <0.03 <0.03 0.04*   BNP (last 3 results) No results for input(s): PROBNP in the last 8760 hours. CBG:  Recent Labs Lab 10/15/16 1733 10/15/16 2217 10/16/16 0754 10/16/16 1206 10/16/16 1649  GLUCAP 131* 176* 186* 165* 124*   Studies: Ct Angio Chest Pe W Or Wo Contrast  Result Date: 10/16/2016 CLINICAL DATA:  Chest pain.  Abnormal EKG.  EXAM: CT ANGIOGRAPHY CHEST WITH CONTRAST TECHNIQUE: Multidetector CT imaging of the chest was performed using the standard protocol during bolus administration of intravenous contrast. Multiplanar CT image reconstructions and MIPs were obtained to evaluate the vascular anatomy. CONTRAST:  100 cc Isovue 370 COMPARISON:  Chest x-ray dated 10/10/2016 FINDINGS: Cardiovascular: Satisfactory opacification of the pulmonary arteries to the segmental level. No evidence of pulmonary embolism. Normal heart size. No pericardial effusion. Mediastinum/Nodes: No enlarged mediastinal, hilar, or axillary lymph nodes. Thyroid gland, trachea, and esophagus demonstrate no significant findings. Lungs/Pleura: Lungs are clear. No pleural effusion or pneumothorax. Upper Abdomen: No acute abnormality. Musculoskeletal: No chest wall abnormality. No acute or significant osseous findings. Review of the MIP images confirms the above findings. IMPRESSION: Normal CT angiogram of the chest. Electronically Signed   By: Lorriane Shire M.D.   On: 10/16/2016 18:03  Scheduled Meds: . atorvastatin  20 mg Oral q1800  . DULoxetine  30 mg Oral Daily  . gabapentin  300 mg Oral QHS  . insulin aspart  0-15 Units Subcutaneous TID WC  . insulin aspart  0-5 Units Subcutaneous QHS  . insulin aspart protamine- aspart  30 Units Subcutaneous BID WC  . pantoprazole  40 mg Oral BID AC  . sodium chloride flush  3 mL Intravenous Q12H  . sucralfate  1 g Oral TID WC & HS   Continuous Infusions: PRN Meds: acetaminophen **OR** acetaminophen, dextrose, gi cocktail, nitroGLYCERIN, ondansetron (ZOFRAN) IV  Time spent: 35 minutes  Author: Berle Mull, MD [facility] Pager: [phone] 10/16/2016 7:41 PM  If 7PM-7AM, please contact night-coverage at [email or web address], password [email or web address]

## 2016-10-17 LAB — GLUCOSE, CAPILLARY
Glucose-Capillary: 174 mg/dL — ABNORMAL HIGH (ref 65–99)
Glucose-Capillary: 204 mg/dL — ABNORMAL HIGH (ref 65–99)

## 2016-10-17 LAB — BASIC METABOLIC PANEL
Anion gap: 8 (ref 5–15)
BUN: 8 mg/dL (ref 6–20)
CO2: 32 mmol/L (ref 22–32)
Calcium: 8.2 mg/dL — ABNORMAL LOW (ref 8.9–10.3)
Chloride: 101 mmol/L (ref 101–111)
Creatinine, Ser: 1.07 mg/dL (ref 0.61–1.24)
GFR calc Af Amer: >60 mL/min (ref >60)
GLUCOSE: 127 mg/dL — AB (ref 65–99)
POTASSIUM: 3.2 mmol/L — AB (ref 3.5–5.1)
Sodium: 141 mmol/L (ref 135–145)

## 2016-10-19 ENCOUNTER — Inpatient Hospital Stay: Payer: Self-pay

## 2016-10-19 NOTE — Discharge Summary (Addendum)
[facility] Discharge Summary   Patient: Brady Gibbs UUV:[id number]   PCP: Patient, No Pcp Per DOB: 10/16/1980   Date of admission: 10/10/2016   Date of discharge: 10/17/2016    Discharge Diagnoses:  Principal Problem:   DKA (diabetic ketoacidoses) ([place]) Active Problems:   Diabetes mellitus, type 2 ([place])   Severe obstructive sleep apnea   Diabetic ketoacidosis without coma associated with type 2 diabetes mellitus ([place])   Dysphagia   Abnormal esophagram   Admitted From: home Disposition:  home  Recommendations for Outpatient Follow-up:  1. Please follow up with PCP in 1 week as well as cardiology as recommended   Follow-up [place]. Go on 10/19/2016.   Why:  at 8:30.  Please go to pharmacy clinic post discharge to have prescriptions filled at pharmacy.  Pharmacy closes at [employer] information: [place] [id number] [phone]         Diet recommendation: carb modified diet bland  Activity: The patient is advised to gradually reintroduce usual activities.  Discharge Condition: good  Code Status: full code  History of present illness: As per the H and P dictated on admission, "Brady Gibbs is a 36 y.o. male with PMH of IDDM, Tobacco use, non adherence, h/o DKA presented with nausea and vomiting. He is alert, oriented but somnolent in emergency room, follows commands. D/w patient, and his mother at the bedside.  Patient states that he was not taking his insulin for several days. He developed nausea, vomiting and mild abdominal discomfort since yesterday. He denies acute chest pains, no shortness of breath, no palpitations. He denies fever, no cough, no diarrhea, no acute abdominal pains, no focal weakness or paresthesias.  -ED: patient is in DKA. abg-[id number]. Labd: sodium -120. Clinically severe dehydration with AKI"  Hospital Course:  Summary of his active problems in the  hospital is as following. 1. DKA Due to poor compliance as the patient stopped taking all his medication including insulin recently. Currently insulin dose has been increased for long-acting. Continue sliding scale. Appreciate diabetes coordinator intervention. Carb modified diet.  2. Dysphagia for solid. Severe reflux esophagitis. GI consulted. Continue PPI, Carafate, GI cocktail when necessary. EGD was showing severe erosive esophagitis. Biopsies are showing erosive ulcer, viral studies were negative. Continue current management on discharge. Protonix was changed to omeprazole  3. Acute kidney injury. Due to dehydration. Creatinine significantly elevated on admission 3.2. Currently resolved and back to normal baseline. Monitor.  4. Chest pain. Abnormal EKG. Elevated troponin. Right atrial mass Patient initially had elevated troponin on admission which is actually secondary to poor clearance from acute kidney injury. Patient did have complaints of some chest pain today EKG was showing diffuse ST elevation although not consistent with an ACS or systemic picture. Discussed with cardiology on call. Troponin negative as well. Symptoms classical for GI chest pain. Due to the presence of ST changes as well as concern for pericarditis echocardiogram was performed. No acute abnormality identified but echocardiogram did show a right atrial mass. I discussed with cardiology, the patient does not have any clinical signs or evidence of infective endocarditis. Cardiac was requested to rule out PE. A CT chest PE protocol was performed which was also negative for any PE or effusion. Patient was recommended to get outpatient TEE for further workup of the mass.  5. Hyponatremia. Currently resolved  6. Metabolic encephalopathy. Obstructive sleep apnea Currently resolved. Noncompliant with CPAP.   7. History  of depression and anxiety. Hallucination as well as noncompliance with  medical regimen. Next and psychiatry consulted. Recommended to start Cymbalta and gabapentin. No indication for inpatient admission.  All other chronic medical condition were stable during the hospitalization.  Patient was ambulatory without any assistance. On the day of the discharge the patient's vitals were stable, and no other acute medical condition were reported by patient. the patient was felt safe to be discharge at home with family.  Procedures and Results:  Echocardiogram  EGD   Consultations:  GI  Psychiatry  Phone consultation with cardiology  DISCHARGE MEDICATION: Discharge Medication List as of 10/17/2016  1:26 PM    START taking these medications   Details  blood glucose meter kit and supplies Dispense based on patient and insurance preference. Use up to four times daily as directed. (FOR ICD-9 250.00, 250.01)., Print    Insulin Syringe-Needle U-100 (INSULIN SYRINGE .5CC/31GX5/16") 31G X 5/16" 0.5 ML MISC 1 Act by Does not apply route 2 (two) times daily., Starting Fri 10/16/2016, Normal    omeprazole (PRILOSEC) 40 MG capsule Take 1 capsule (40 mg total) by mouth 2 (two) times daily., Starting Fri 10/16/2016, Print      CONTINUE these medications which have CHANGED   Details  DULoxetine (CYMBALTA) 30 MG capsule Take 1 capsule (30 mg total) by mouth daily., Starting Fri 10/16/2016, Print    gabapentin (NEURONTIN) 300 MG capsule Take 1 capsule (300 mg total) by mouth at bedtime., Starting Fri 10/16/2016, Until Sat 10/16/2017, Print    insulin aspart protamine- aspart (NOVOLOG MIX 70/30) (70-30) 100 UNIT/ML injection Inject 0.35 mLs (35 Units total) into the skin 2 (two) times daily with a meal., Starting Fri 10/16/2016, Print    metFORMIN (GLUCOPHAGE XR) 500 MG 24 hr tablet Take 1 tablet (500 mg total) by mouth daily with breakfast. Increase to twice a day after 1 week., Starting Fri 10/16/2016, Print    sucralfate (CARAFATE) 1 GM/10ML suspension Take 10 mLs (1 g  total) by mouth 4 (four) times daily -  with meals and at bedtime., Starting Fri 10/16/2016, Print      CONTINUE these medications which have NOT CHANGED   Details  atorvastatin (LIPITOR) 20 MG tablet Take 1 tablet (20 mg total) by mouth daily at 6 PM., Starting Thu 01/03/2015, Normal    podofilox (CONDYLOX) 0.5 % gel Apply topically 2 (two) times daily. Apply for 3 consecutive days followed by 4 days without treatment. Repeat as needed for up to 4 weeks., Starting Thu 08/09/2014, Normal      STOP taking these medications     insulin NPH-regular Human (NOVOLIN 70/30) (70-30) 100 UNIT/ML injection      Insulin Syringe-Needle U-100 (INSULIN SYRINGE .3CC/31GX5/16") 31G X 5/16" 0.3 ML MISC      pantoprazole (PROTONIX) 40 MG tablet      pantoprazole (PROTONIX) 40 MG tablet        No Known Allergies Discharge Instructions    Diet - low sodium heart healthy    Complete by:  As directed    Diet Carb Modified    Complete by:  As directed    Discharge instructions    Complete by:  As directed    It is important that you read following instructions as well as go over your medication list with RN to help you understand your care after this hospitalization.  Discharge Instructions: Please follow-up with PCP in one week  Please request your primary care physician to go over all  Hospital Tests and Procedure/Radiological results at the follow up,  Please get all Hospital records sent to your PCP by signing hospital release before you go home.   Do not take more than prescribed Pain, Sleep and Anxiety Medications. You were cared for by a hospitalist during your hospital stay. If you have any questions about your discharge medications or the care you received while you were in the hospital after you are discharged, you can call the unit and ask to speak with the hospitalist on call if the hospitalist that took care of you is not available.  Once you are discharged, your primary care physician will  handle any further medical issues. Please note that NO REFILLS for any discharge medications will be authorized once you are discharged, as it is imperative that you return to your primary care physician (or establish a relationship with a primary care physician if you do not have one) for your aftercare needs so that they can reassess your need for medications and monitor your lab values. You Must read complete instructions/literature along with all the possible adverse reactions/side effects for all the Medicines you take and that have been prescribed to you. Take any new Medicines after you have completely understood and accept all the possible adverse reactions/side effects. Wear Seat belts while driving. If you have smoked or chewed Tobacco in the last 2 yrs please stop smoking and/or stop any Recreational drug use.   Increase activity slowly    Complete by:  As directed      Discharge Exam: Filed Weights   10/10/16 2020 10/15/16 1010  Weight: 127 kg (279 lb 15.8 oz) 110.2 kg (243 lb)   Vitals:   10/16/16 2244 10/17/16 0455  BP:  (!) 112/55  Pulse: 86 78  Resp: 18 17  Temp:  98.1 F (36.7 C)   General: Appear in no distress, no Rash; Oral Mucosa moist. Cardiovascular: S1 and S2 Present, no Murmur, no JVD Respiratory: Bilateral Air entry present and Clear to Auscultation, no Crackles, no wheezes Abdomen: Bowel Sound present, Soft and no tenderness Extremities: no Pedal edema, no calf tenderness Neurology: Grossly no focal neuro deficit.  The results of significant diagnostics from this hospitalization (including imaging, microbiology, ancillary and laboratory) are listed below for reference.    Significant Diagnostic Studies: Ct Head Wo Contrast  Result Date: 10/10/2016 CLINICAL DATA:  Nausea vomiting EXAM: CT HEAD WITHOUT CONTRAST TECHNIQUE: Contiguous axial images were obtained from the base of the skull through the vertex without intravenous contrast. COMPARISON:  02/03/2010  FINDINGS: Brain: No evidence of acute infarction, hemorrhage, hydrocephalus, extra-axial collection or mass lesion/mass effect. Vascular: No hyperdense vessels.  No unexpected calcification. Skull: No fracture or suspicious bone lesion Sinuses/Orbits: Mucosal thickening in the sphenoid sinus. Mucous retention cyst or lobulated mucosal thickening in the right maxillary sinus. Mild ethmoid disease. No acute orbital abnormality Other: None IMPRESSION: No CT evidence for acute intracranial abnormality. Electronically Signed   By: Donavan Foil M.D.   On: 10/10/2016 18:29   Ct Angio Chest Pe W Or Wo Contrast  Result Date: 10/16/2016 CLINICAL DATA:  Chest pain.  Abnormal EKG. EXAM: CT ANGIOGRAPHY CHEST WITH CONTRAST TECHNIQUE: Multidetector CT imaging of the chest was performed using the standard protocol during bolus administration of intravenous contrast. Multiplanar CT image reconstructions and MIPs were obtained to evaluate the vascular anatomy. CONTRAST:  100 cc Isovue 370 COMPARISON:  Chest x-ray dated 10/10/2016 FINDINGS: Cardiovascular: Satisfactory opacification of the pulmonary arteries to the segmental level.  No evidence of pulmonary embolism. Normal heart size. No pericardial effusion. Mediastinum/Nodes: No enlarged mediastinal, hilar, or axillary lymph nodes. Thyroid gland, trachea, and esophagus demonstrate no significant findings. Lungs/Pleura: Lungs are clear. No pleural effusion or pneumothorax. Upper Abdomen: No acute abnormality. Musculoskeletal: No chest wall abnormality. No acute or significant osseous findings. Review of the MIP images confirms the above findings. IMPRESSION: Normal CT angiogram of the chest. Electronically Signed   By: Lorriane Shire M.D.   On: 10/16/2016 18:03   Dg Esophagus  Result Date: 10/13/2016 CLINICAL DATA:  Dysphagia to solids and liquids, progressive x3 months, oral thrush EXAM: ESOPHOGRAM / BARIUM SWALLOW / BARIUM TABLET STUDY TECHNIQUE: Combined double contrast  and single contrast examination performed using effervescent crystals, thick barium liquid, and thin barium liquid. The patient was observed with fluoroscopy swallowing a 13 mm barium sulphate tablet. FLUOROSCOPY TIME:  Fluoroscopy Time: 1 minutes 42 seconds of low-dose pulsed fluoroscopy Radiation Exposure Index (if provided by the fluoroscopic device): 27.9 mGy Number of Acquired Spot Images: 9 COMPARISON:  None. FINDINGS: Normal esophageal peristalsis. No fixed esophageal narrowing or stricture. A 13 mm barium tablet passed into the stomach without delay. Mildly irregular/shaggy appearance of the distal esophagus raises the possibility of distal esophagitis, likely on the basis of reflux. Small hiatal hernia. Station could not be assessed for gastroesophageal reflux due to the inability to clear his esophagus in the prone position. IMPRESSION: Irregular appearance of the distal esophagus, suggesting possible (reflex) esophagitis, although equivocal. Consider upper endoscopy for further evaluation as clinically warranted. No fixed esophageal narrowing or stricture. Small hiatal hernia. Electronically Signed   By: Julian Hy M.D.   On: 10/13/2016 09:36   Dg Chest Port 1 View  Result Date: 10/10/2016 CLINICAL DATA:  36 y/o  M; leukocytosis. EXAM: PORTABLE CHEST 1 VIEW COMPARISON:  03/21/2012 chest radiograph. FINDINGS: Stable normal cardiac silhouette given projection and technique. Low lung volumes accentuate pulmonary markings. No focal consolidation or effusion identified. Bones are unremarkable. IMPRESSION: Low lung volumes.  No acute pulmonary process identified Electronically Signed   By: Kristine Garbe M.D.   On: 10/10/2016 17:39    Microbiology: Recent Results (from the past 240 hour(s))  MRSA PCR Screening     Status: None   Collection Time: 10/10/16  8:23 PM  Result Value Ref Range Status   MRSA by PCR NEGATIVE NEGATIVE Final    Comment:        The GeneXpert MRSA Assay  (FDA approved for NASAL specimens only), is one component of a comprehensive MRSA colonization surveillance program. It is not intended to diagnose MRSA infection nor to guide or monitor treatment for MRSA infections.      Labs: CBC:  Recent Labs Lab 10/13/16 0153 10/14/16 2351  WBC 9.3 7.0  NEUTROABS  --  3.8  HGB 11.8* 11.2*  HCT 37.0* 35.9*  MCV 85.8 86.7  PLT 183 191   Basic Metabolic Panel:  Recent Labs Lab 10/13/16 0153 10/14/16 2351 10/16/16 0422 10/17/16 0427  NA 139 140 139 141  K 3.0* 3.3* 3.2* 3.2*  CL 105 102 103 101  CO2 _0 32  GLUCOSE 286* 201* 185* 127*  BUN _1 CREATININE 1.16 0.98 0.96 1.07  CALCIUM 8.6* 8.6* 8.3* 8.2*  MG  --  2.2  --   --    Liver Function Tests: No results for input(s): AST, ALT, ALKPHOS, BILITOT, PROT, ALBUMIN in the last 168 hours. No results for input(s): LIPASE,  AMYLASE in the last 168 hours. No results for input(s): AMMONIA in the last 168 hours. Cardiac Enzymes:  Recent Labs Lab 10/14/16 1228 10/14/16 1824 10/14/16 2351  TROPONINI <0.03 <0.03 0.04*   BNP (last 3 results) No results for input(s): BNP in the last 8760 hours. CBG:  Recent Labs Lab 10/16/16 1206 10/16/16 1649 10/16/16 2200 10/17/16 0759 10/17/16 1213  GLUCAP 165* 124* 152* 174* 204*   Time spent: 35 minutes  Signed:  PATEL, PRANAV  [facility] 10/17/2016  , 4:24 PM

## 2016-10-19 NOTE — Progress Notes (Deleted)
Patient ID: Brady Gibbs, male   DOB: June 29, 1980, 36 y.o.   MRN: [id number] After being hospitalized 10/10/2016-10/17/2016 for N/V and ultimately DKA.  Discharge summary not available at time of OV.  A1C>15.5 10/10/2016.  See hospital course.  Patient was treated with IVF and insulin, stabilized, and discharged on 10/17/2016.

## 2016-10-27 ENCOUNTER — Ambulatory Visit (INDEPENDENT_AMBULATORY_CARE_PROVIDER_SITE_OTHER): Payer: Self-pay | Admitting: Physician Assistant

## 2016-10-27 ENCOUNTER — Encounter: Payer: Self-pay | Admitting: Physician Assistant

## 2016-10-27 VITALS — BP 122/72 | HR 81 | Ht 73.0 in | Wt 255.0 lb

## 2016-10-27 DIAGNOSIS — Q249 Congenital malformation of heart, unspecified: Secondary | ICD-10-CM

## 2016-10-27 DIAGNOSIS — Q248 Other specified congenital malformations of heart: Secondary | ICD-10-CM

## 2016-10-27 DIAGNOSIS — R931 Abnormal findings on diagnostic imaging of heart and coronary circulation: Secondary | ICD-10-CM

## 2016-10-27 NOTE — Patient Instructions (Addendum)
Medication Instructions:  Your physician recommends that you continue on your current medications as directed. Please refer to the Current Medication list given to you today. If you need a refill on your cardiac medications before your next appointment, please call your pharmacy.  Follow-Up: Your physician wants you to follow-up in: AS NEEDED DR [place].   [facility] you [facility]!!

## 2016-10-27 NOTE — Progress Notes (Signed)
Cardiology Office Note   Date:  10/27/2016   ID:  Cleburne Savini, DOB [birth year], MRN [id number]  PCP:  Patient, No Pcp Per  Cardiologist:  Georgena Spurling, PA-C   Chief Complaint  Patient presents with  . Follow-up    hosp  . Weight Gain    History of Present Illness: Mickeal Daws is a 36 y.o. 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  Harutyun Monteverde presents for cardiology evaluation.  Since he got out of the hospital, his blood sugars have been better controlled, generally < 200. He had a brief episode of blurry vision the other day. It only lasted about 10 minutes, no other symptoms. He is not sure what his blood sugar was at that time but it may have been low. He believes he ate something and the symptoms resolved.  He has lost weight, this is deliberate. He is eating < 2000 calories a day, made multiple changes that are healthier for him.   He never gets chest pain. He has had some epigastric cramping pain, better with position changes. This is not exertional, not associated with any other symptoms.  No SOB, PND, orthopnea. He denies DOE, walks all the time. CPAP dries out his mouth but he is using it.    Past Medical History:  Diagnosis Date  . Diabetes mellitus without complication (HCC)   . DKA (diabetic ketoacidoses) (HCC) 10/10/2016  . OSA on CPAP     Past Surgical History:  Procedure Laterality Date  . ESOPHAGOGASTRODUODENOSCOPY N/A 10/15/2016   Procedure: ESOPHAGOGASTRODUODENOSCOPY (EGD);  Surgeon: Meryl Dare, MD;  Location: [facility];  Service: Endoscopy;  Laterality: N/A;  . FOOT SURGERY Right  at age 66   cyst removal  . HERNIA REPAIR     umbical hernia repair at 64 or 36 years old.    Current Outpatient  Prescriptions  Medication Sig Dispense Refill  . atorvastatin (LIPITOR) 20 MG tablet Take 20 mg by mouth daily.    . insulin aspart protamine- aspart (NOVOLOG MIX 70/30) (70-30) 100 UNIT/ML injection Inject 0.35 mLs (35 Units total) into the skin 2 (two) times daily with a meal. 10 mL 0  . omeprazole (PRILOSEC) 40 MG capsule Take 1 capsule (40 mg total) by mouth 2 (two) times daily. 60 capsule 0  . podofilox (CONDYLOX) 0.5 % gel Apply topically 2 (two) times daily.    . sucralfate (CARAFATE) 1 GM/10ML suspension Take 10 mLs (1 g total) by mouth 4 (four) times daily -  with meals and at bedtime. 420 mL 0   No current facility-administered medications for this visit.     Allergies:   Patient has no known allergies.    Social History:  The patient  reports that he has been smoking Cigarettes.  He has been smoking about 0.50 packs per day. He has never used smokeless tobacco. He reports that he does not drink alcohol or use drugs.   Family History:  Family History  Problem Relation Age of Onset  . Heart failure Maternal Grandmother   . CAD Neg Hx        mother   Family Status  Relation Status  . Mother Alive  . Father Alive  . MGM Deceased  . MGF Alive  . PGM Deceased  .  PGF Alive    ROS:  Please see the history of present illness. All other systems are reviewed and negative.    PHYSICAL EXAM: VS:  BP 122/72 (BP Location: Right Arm)   Pulse 81   Ht 6\' 1"  (1.854 m)   Wt 255 lb (115.7 kg)   SpO2 99%   BMI 33.64 kg/m  , BMI Body mass index is 33.64 kg/m. GEN: Well nourished, well developed, male in no acute distress  HEENT: normal for age  Neck: no JVD, no carotid bruit, no masses Cardiac: RRR; no murmur, no rubs, or gallops Respiratory:  clear to auscultation bilaterally, normal work of breathing GI: soft, nontender, nondistended, + BS MS: no deformity or atrophy; no edema; distal pulses are 2+ in all 4 extremities   Skin: warm and dry, no rash Neuro:  Strength and  sensation are intact Psych: euthymic mood, full affect   EKG:   06/20 ECG is SR w/ diffuse ST elevation, Consistent with early report  ECHO: 10/16/2016 - Left ventricle: The cavity size was normal. Wall thickness was   normal. Systolic function was normal. The estimated ejection   fraction was in the range of 60% to 65%. Wall motion was normal;   there were no regional wall motion abnormalities. Left   ventricular diastolic function parameters were normal. Doppler   parameters are consistent with indeterminate ventricular filling pressure. - Aortic valve: Transvalvular velocity was within the normal range.   There was no stenosis. There was no regurgitation. - Mitral valve: Transvalvular velocity was within the normal range.   There was no evidence for stenosis. There was no regurgitation. - Right ventricle: Systolic function was normal. - Right atrium: There was a 1.6 cm (L) x 0.5 cm (W), highly   mobilemasson the right side of the interatrial septum. - Tricuspid valve: There was trivial regurgitation. Impressions: - There was a highly mobile mass in the right atrium. It appears to   be attached low in the intra-atrial septum. Cannot exclude   attachment to the tricuspid valve. 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 If there is concern for endocarditis or throbus,   would consider TEE.   Recent Labs: 10/10/2016: ALT 17 10/14/2016: Hemoglobin 11.2; Magnesium 2.2; Platelets 168 10/17/2016: BUN 8; Creatinine, Ser 1.07; Potassium 3.2; Sodium 141    Lipid Panel    Component Value Date/Time   CHOL 257 (H) 08/09/2014 1608   TRIG 185 (H) 08/09/2014 1608   HDL 29 (L) 08/09/2014 1608   CHOLHDL 8.9 08/09/2014 1608   VLDL 37 08/09/2014 1608   LDLCALC 191 (H) 08/09/2014 1608     Wt Readings from Last 3 Encounters:  10/27/16 255 lb (115.7 kg)  10/15/16 243 lb (110.2 kg)  01/01/15 279 lb 15.8 oz (127 kg)       Other studies Reviewed: Additional studies/ records that were reviewed today include: Hospital records and testing.  ASSESSMENT AND PLAN: The patient and the plan were reviewed with Dr. [place] who agrees  1.  Abnormal echo: See report above. 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 Patient is completely asymptomatic. He is encouraged to continue the healthy lifestyle he is doing now and follow up with cardiology as needed.   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   2. CRF: Encouraged heart healthy ADA diet and exercise.   Current medicines are reviewed at length with the  patient today.  The patient does not have concerns regarding medicines.  The following changes have been made:  no change  Labs/ tests ordered today include:  No orders of the defined types were placed in this encounter.    Disposition:   FU with Dr Herbie Baltimore  Signed, Theodore Demark, PA-C  10/27/2016 10:13 AM    [facility] Phone: [phone]; Fax: [phone]  This note was written with the assistance of speech recognition software. Please excuse any transcriptional errors.

## 2017-10-29 ENCOUNTER — Emergency Department (HOSPITAL_COMMUNITY): Payer: Self-pay

## 2017-10-29 ENCOUNTER — Encounter (HOSPITAL_COMMUNITY): Payer: Self-pay

## 2017-10-29 ENCOUNTER — Emergency Department (HOSPITAL_COMMUNITY)
Admission: EM | Admit: 2017-10-29 | Discharge: 2017-10-29 | Disposition: A | Discharge status: Home / Self Care | Payer: Self-pay | Attending: Emergency Medicine | Admitting: Emergency Medicine

## 2017-10-29 ENCOUNTER — Other Ambulatory Visit: Payer: Self-pay

## 2017-10-29 DIAGNOSIS — E119 Type 2 diabetes mellitus without complications: Secondary | ICD-10-CM | POA: Insufficient documentation

## 2017-10-29 DIAGNOSIS — F17210 Nicotine dependence, cigarettes, uncomplicated: Secondary | ICD-10-CM | POA: Insufficient documentation

## 2017-10-29 DIAGNOSIS — R0602 Shortness of breath: Secondary | ICD-10-CM | POA: Insufficient documentation

## 2017-10-29 DIAGNOSIS — Z79899 Other long term (current) drug therapy: Secondary | ICD-10-CM | POA: Insufficient documentation

## 2017-10-29 DIAGNOSIS — Z794 Long term (current) use of insulin: Secondary | ICD-10-CM | POA: Insufficient documentation

## 2017-10-29 LAB — I-STAT TROPONIN, ED: Troponin i, poc: 0 ng/mL (ref 0.00–0.08)

## 2017-10-29 LAB — BASIC METABOLIC PANEL
ANION GAP: 8 (ref 5–15)
BUN: 12 mg/dL (ref 6–20)
CALCIUM: 8.9 mg/dL (ref 8.9–10.3)
CO2: 24 mmol/L (ref 22–32)
CREATININE: 1.27 mg/dL — AB (ref 0.61–1.24)
Chloride: 108 mmol/L (ref 98–111)
GFR calc Af Amer: >60 mL/min (ref >60)
GLUCOSE: 111 mg/dL — AB (ref 70–99)
Potassium: 4 mmol/L (ref 3.5–5.1)
Sodium: 140 mmol/L (ref 135–145)

## 2017-10-29 LAB — CBC
HCT: 47.5 % (ref 39.0–52.0)
Hemoglobin: 14.7 g/dL (ref 13.0–17.0)
MCH: 27 pg (ref 26.0–34.0)
MCHC: 30.9 g/dL (ref 30.0–36.0)
MCV: 87.2 fL (ref 78.0–100.0)
PLATELETS: 295 10*3/uL (ref 150–400)
RBC: 5.45 MIL/uL (ref 4.22–5.81)
RDW: 13.4 % (ref 11.5–15.5)
WBC: 6 10*3/uL (ref 4.0–10.5)

## 2017-10-29 NOTE — ED Notes (Signed)
Pt's oxygen saturation fluctuated between 93% and 95% on room air while ambulating; no SOB observed after ambulation, pt not c/o SOB or dizziness; will continue to monitor

## 2017-10-29 NOTE — ED Provider Notes (Signed)
[facility] EMERGENCY DEPARTMENT Provider Note   CSN: [id number] Arrival date & time: 10/29/17  1148     History   Chief Complaint Chief Complaint  Patient presents with  . Shortness of Breath    HPI Brady Gibbs is a 37 y.o. male.  He presents to the emergency department today complaining of feeling more short of breath and difficulty catching a deep breath.  States this happened before but over the last 2 days is a little more pronounced.  Says he does not feel like the left side is getting as much air and when he takes a deep breath he will have a twinge in his left low back and hip.  There is been no cough no fevers no chest pain.  He does have a history of sleep apnea and has been using his CPAP.  Said last time he had a feeling like this ended up getting admitted in DKA.  He said that was associated with a lot of vomiting.  He is stop smoking and is losing weight.  Yesterday he woke up with what he calls a kink in his neck that radiated into his shoulder.  The history is provided by the patient.  Shortness of Breath  This is a new problem. The average episode lasts 2 days. The current episode started yesterday. The problem has been gradually improving. Associated symptoms include neck pain. Pertinent negatives include no fever, no headaches, no rhinorrhea, no sore throat, no cough, no sputum production, no hemoptysis, no wheezing, no chest pain, no syncope, no abdominal pain, no rash, no leg pain and no leg swelling. It is unknown what precipitated the problem. He has tried nothing for the symptoms. The treatment provided no relief.    Past Medical History:  Diagnosis Date  . Diabetes mellitus without complication (HCC)   . DKA (diabetic ketoacidoses) (HCC) 10/10/2016  . OSA on CPAP     Patient Active Problem List   Diagnosis Date Noted  . Dysphagia   . Abnormal esophagram   . DKA (diabetic ketoacidoses) (HCC) 01/01/2015  . Candidiasis   . Diabetic  ketoacidosis without coma associated with type 2 diabetes mellitus (HCC)   . Metabolic acidosis   . Insomnia 08/10/2014  . Onychomycosis of right great toe 08/10/2014  . Body mass index (BMI) of 40.0-44.9 in adult (HCC) 08/10/2014  . Hyperlipidemia 09/28/2012  . Condyloma acuminata 09/27/2012  . Severe obstructive sleep apnea 06/15/2012  . Diabetes mellitus, type 2 (HCC) 04/15/2012    Past Surgical History:  Procedure Laterality Date  . ESOPHAGOGASTRODUODENOSCOPY N/A 10/15/2016   Procedure: ESOPHAGOGASTRODUODENOSCOPY (EGD);  Surgeon: Meryl Dare, MD;  Location: [facility] ENDOSCOPY;  Service: Endoscopy;  Laterality: N/A;  . FOOT SURGERY Right  at age 50   cyst removal  . HERNIA REPAIR     umbical hernia repair at 32 or 37 years old.        Home Medications    Prior to Admission medications   Medication Sig Start Date End Date Taking? Authorizing Provider  atorvastatin (LIPITOR) 20 MG tablet Take 20 mg by mouth daily.    [provider]  insulin aspart protamine- aspart (NOVOLOG MIX 70/30) (70-30) 100 UNIT/ML injection Inject 0.35 mLs (35 Units total) into the skin 2 (two) times daily with a meal. 10/16/16   Rolly Salter, MD  omeprazole (PRILOSEC) 40 MG capsule Take 1 capsule (40 mg total) by mouth 2 (two) times daily. 10/16/16   Rolly Salter, MD  podofilox (CONDYLOX) 0.5 % gel Apply topically 2 (two) times daily.    [provider]  sucralfate (CARAFATE) 1 GM/10ML suspension Take 10 mLs (1 g total) by mouth 4 (four) times daily -  with meals and at bedtime. 10/16/16   Rolly SalterPatel, Pranav M, MD    Family History Family History  Problem Relation Age of Onset  . Heart failure Maternal Grandmother   . CAD Neg Hx        mother    Social History Social History   Tobacco Use  . Smoking status: Current Every Day Smoker    Packs/day: 0.50    Types: Cigarettes  . Smokeless tobacco: Never Used  Substance Use Topics  . Alcohol use: No    Alcohol/week: 0.0 oz  . Drug  use: No     Allergies   Patient has no known allergies.   Review of Systems Review of Systems  Constitutional: Negative for fever.  HENT: Negative for rhinorrhea and sore throat.   Eyes: Negative for visual disturbance.  Respiratory: Positive for shortness of breath. Negative for cough, hemoptysis, sputum production and wheezing.   Cardiovascular: Negative for chest pain, leg swelling and syncope.  Gastrointestinal: Negative for abdominal pain.  Genitourinary: Negative for dysuria.  Musculoskeletal: Positive for neck pain. Negative for back pain.  Skin: Negative for rash.  Neurological: Negative for headaches.     Physical Exam Updated Vital Signs BP 99/63   Pulse 68   Temp 98.4 F (36.9 C) (Oral)   Resp 12   Ht 6' (1.829 m)   Wt 115.7 kg (255 lb)   SpO2 99%   BMI 34.58 kg/m   Physical Exam  Constitutional: He appears well-developed and well-nourished.  HENT:  Head: Normocephalic and atraumatic.  Mouth/Throat: Oropharynx is clear and moist.  Eyes: Pupils are equal, round, and reactive to light. Conjunctivae and EOM are normal.  Neck: Normal range of motion. Neck supple.  Cardiovascular: Normal rate, regular rhythm, normal heart sounds and intact distal pulses.  No murmur heard. Pulmonary/Chest: Effort normal and breath sounds normal. No respiratory distress.  Abdominal: Soft. There is no tenderness.  Musculoskeletal: He exhibits no edema.       Right lower leg: He exhibits no tenderness and no edema.       Left lower leg: He exhibits no tenderness and no edema.  Neurological: He is alert.  Skin: Skin is warm and dry. Capillary refill takes less than 2 seconds.  Psychiatric: He has a normal mood and affect.  Nursing note and vitals reviewed.    ED Treatments / Results  Labs (all labs ordered are listed, but only abnormal results are displayed) Labs Reviewed  BASIC METABOLIC PANEL - Abnormal; Notable for the following components:      Result Value    Glucose, Bld 111 (*)    Creatinine, Ser 1.27 (*)    All other components within normal limits  CBC  I-STAT TROPONIN, ED    EKG EKG Interpretation  Date/Time:  Friday October 29 2017 11:50:36 EDT Ventricular Rate:  76 PR Interval:  150 QRS Duration: 76 QT Interval:  360 QTC Calculation: 405 R Axis:   101 Text Interpretation:  Normal sinus rhythm Rightward axis Borderline ECG similar to prior 6/18 Confirmed by Meridee ScoreButler, Lc Joynt [phone]([id number]) on 10/29/2017 1:38:50 PM   Radiology Dg Chest 2 View  Result Date: 10/29/2017 CLINICAL DATA:  Shortness of breath. EXAM: CHEST - 2 VIEW COMPARISON:  Radiograph of October 10, 2016. FINDINGS: The heart  size and mediastinal contours are within normal limits. Both lungs are clear. No pneumothorax or pleural effusion is noted. The visualized skeletal structures are unremarkable. IMPRESSION: No active cardiopulmonary disease. Electronically Signed   By: Lupita Raider, M.D.   On: 10/29/2017 12:27    Procedures Procedures (including critical care time)  Medications Ordered in ED Medications - No data to display   Initial Impression / Assessment and Plan / ED Course  I have reviewed the triage vital signs and the nursing notes.  Pertinent labs & imaging results that were available during my care of the patient were reviewed by me and considered in my medical decision making (see chart for details).  Clinical Course as of Oct 31 930  Fri Oct 29, 2017  1633 Remains feeling well.  He had a trending pulse ox and did well with that was not symptomatic.  Not really sure the cause of his symptoms but we have not identified any real problems.  He understands to follow-up with his doctor and return if any worsening symptoms.   [MB]  [id number] Patient has no risk factors for DVT and has not been tachycardic or tachypneic and his sats have been reasonable.   [MB]    Clinical Course User Index [MB] Terrilee Files, MD     Final Clinical Impressions(s) / ED Diagnoses     Final diagnoses:  SOB (shortness of breath)    ED Discharge Orders    None       Terrilee Files, MD 10/30/17 [phone]

## 2017-10-29 NOTE — ED Triage Notes (Signed)
Pt states that yesterday he started having SOB with pain in his left shoulder and left neck with diaphoresis. Denies NV.

## 2017-10-29 NOTE — ED Notes (Signed)
Pt states "when I take a deep breath, I have a sharp pain in my left hip"

## 2017-10-29 NOTE — Discharge Instructions (Signed)
Your evaluated in the emergency department for a few days of increased shortness of breath.  You had a chest x-ray blood work EKG does not show an obvious cause of your symptoms.  Your oxygen level remained very good here and you said your symptoms felt improved.  Will be important for you to follow-up with your doctor and return if any worsening symptoms.

## 2018-01-20 IMAGING — CT CT ANGIO CHEST
2 of 8 series · 19 of 46 positions shown · IV contrast (OMNI)
Comparison: Chest x-ray dated 10/10/2016

CLINICAL DATA: Chest pain.  Abnormal EKG.

EXAM:
CT ANGIOGRAPHY CHEST WITH CONTRAST
TECHNIQUE: Multidetector CT imaging of the chest was performed using the
standard protocol during bolus administration of intravenous
contrast. Multiplanar CT image reconstructions and MIPs were
obtained to evaluate the vascular anatomy.
CONTRAST:  100 cc Isovue 370

[Series 6: thins · axial · 0.79mm/px · z∈[+75,+368]mm · 16 of 323 slices shown]
[im 15/323  lung]
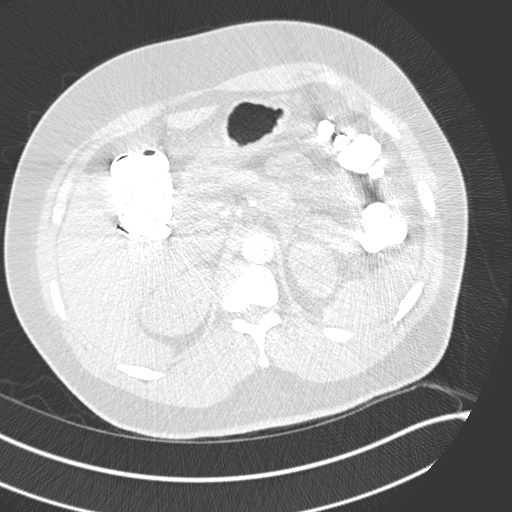
[im 30/323  soft-tissue]
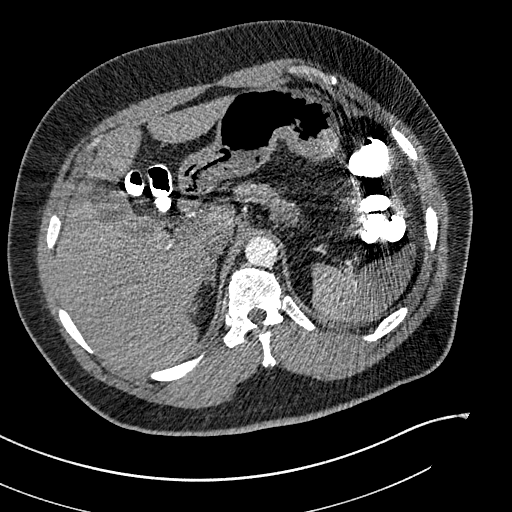
[im 59/323  lung]
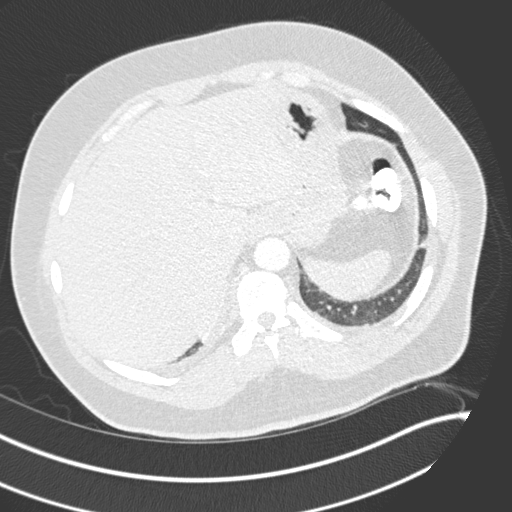
[im 74/323  soft-tissue]
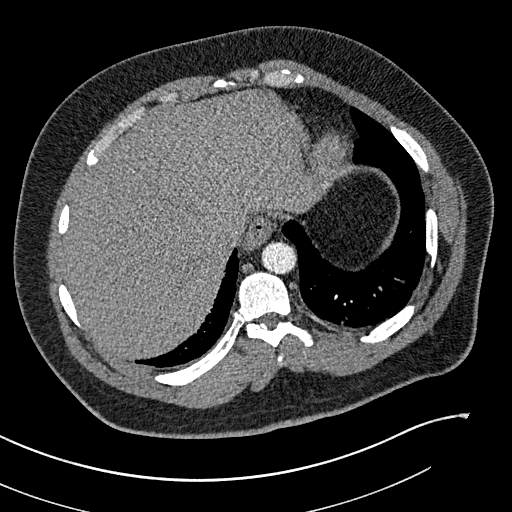
[im 88/323  lung]
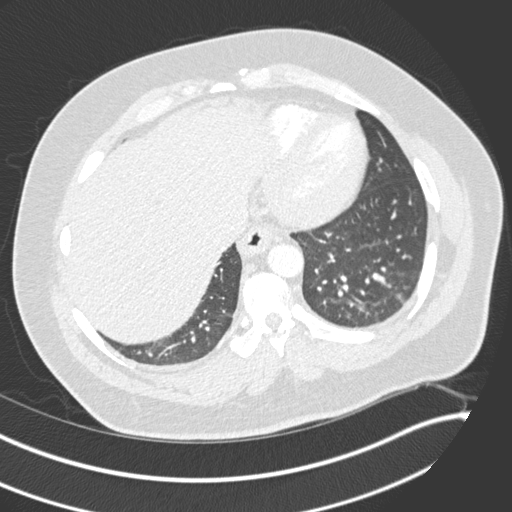
[im 118/323  soft-tissue]
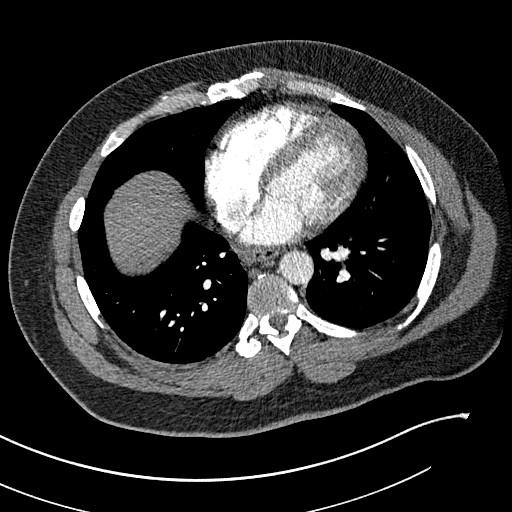
[im 132/323  lung]
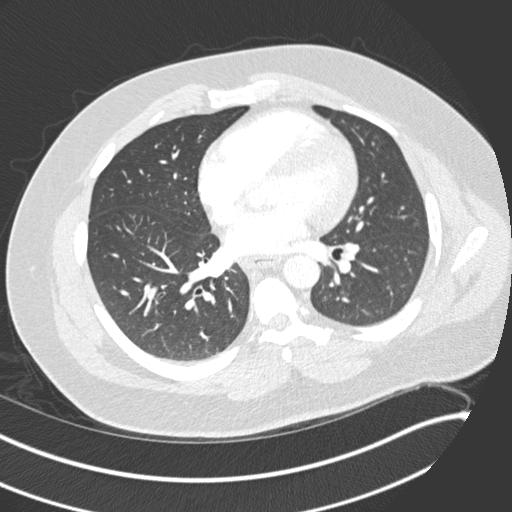
[im 147/323  soft-tissue]
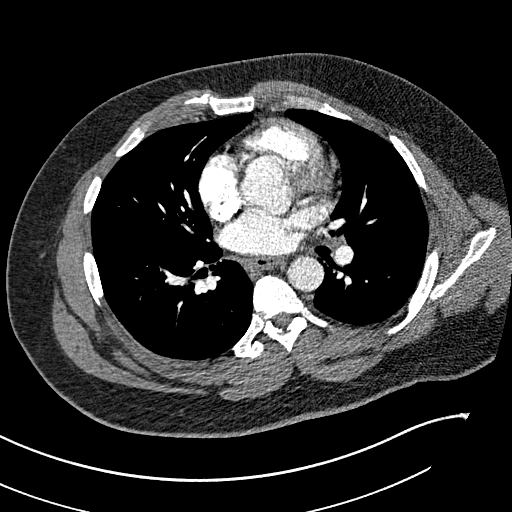
[im 176/323  lung]
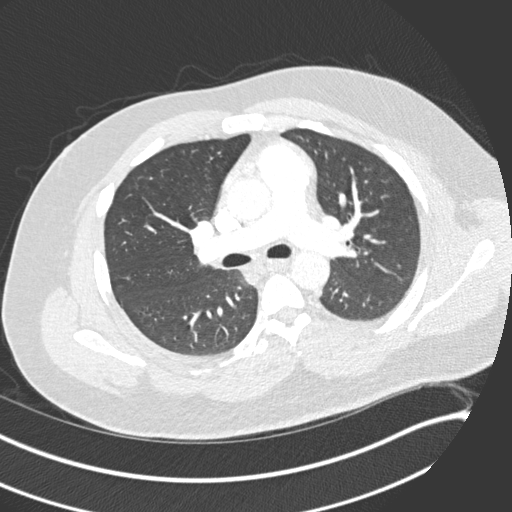
[im 191/323  soft-tissue]
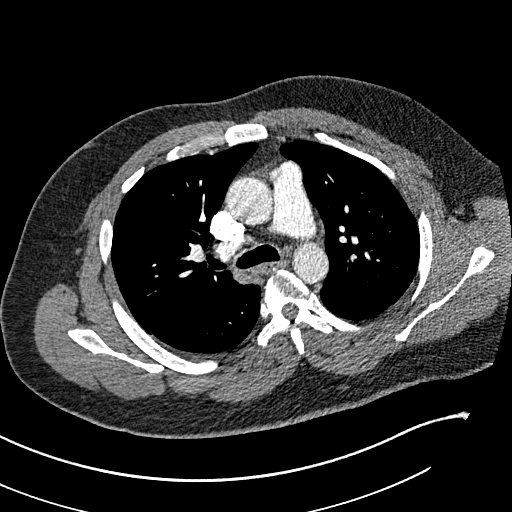
[im 205/323  lung]
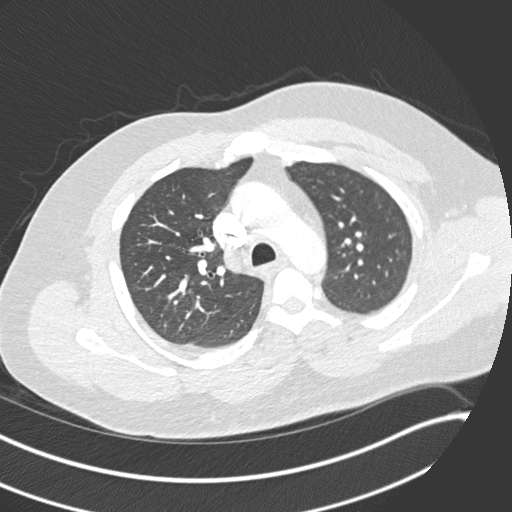
[im 235/323  soft-tissue]
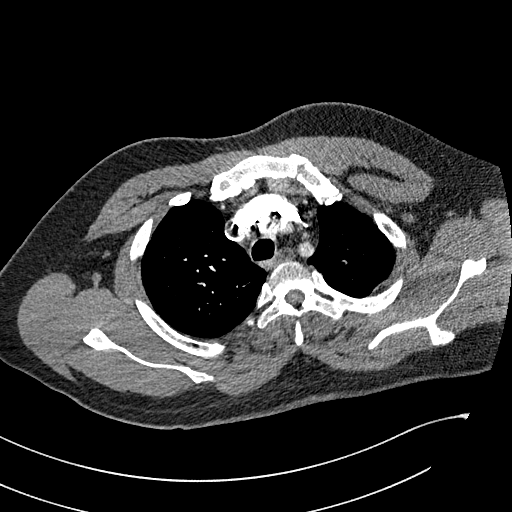
[im 249/323  lung]
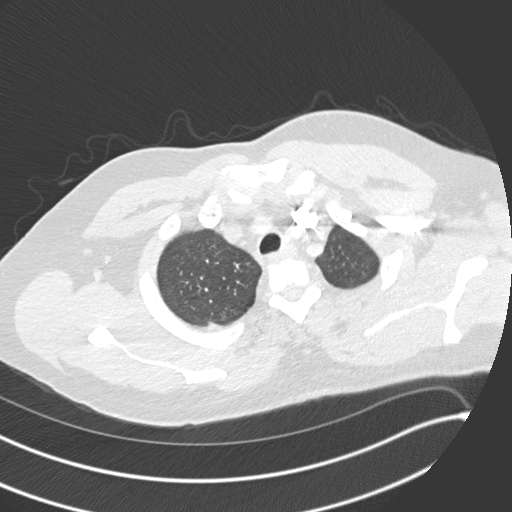
[im 264/323  soft-tissue]
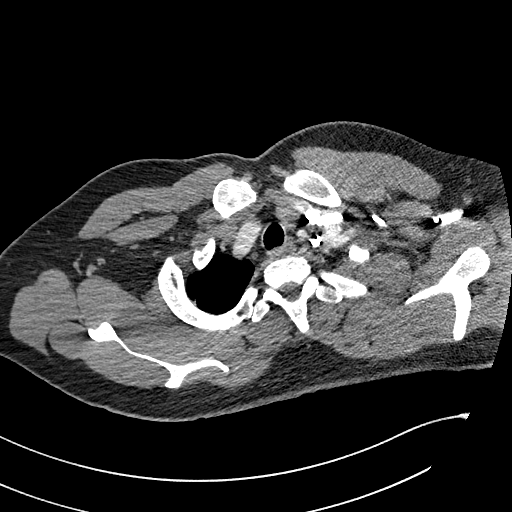
[im 293/323  lung]
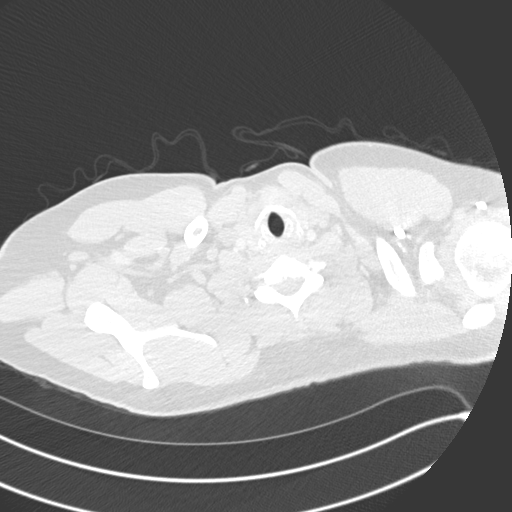
[im 308/323  soft-tissue]
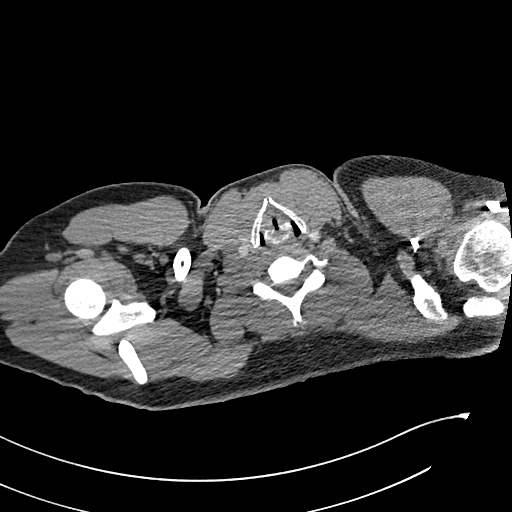

[Series 8: coronal mpr · coronal · 0.66mm/px · 3 of 162 slices shown]
[im 41/162  soft-tissue]
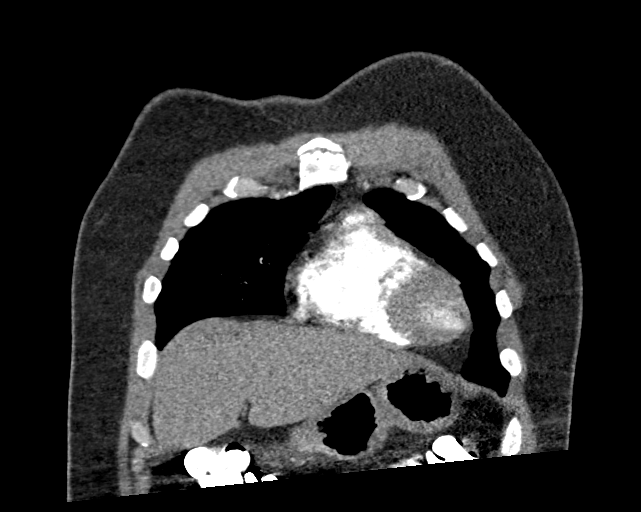
[im 81/162  soft-tissue]
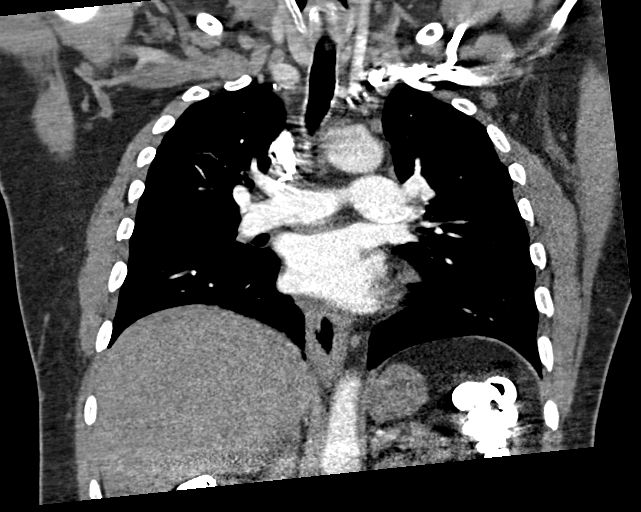
[im 121/162  soft-tissue]
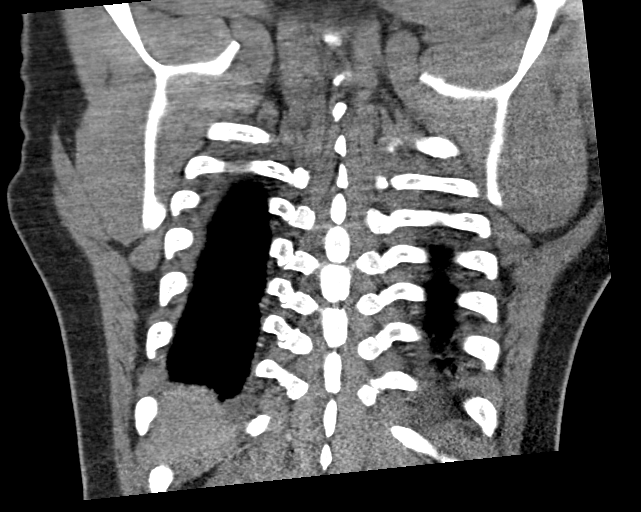

[19 of 46 positions shown; findings below may reference images not displayed]

FINDINGS: Cardiovascular: Satisfactory opacification of the pulmonary arteries
to the segmental level. No evidence of pulmonary embolism. Normal
heart size. No pericardial effusion.

Mediastinum/Nodes: No enlarged mediastinal, hilar, or axillary lymph
nodes. Thyroid gland, trachea, and esophagus demonstrate no
significant findings.

Lungs/Pleura: Lungs are clear. No pleural effusion or pneumothorax.

Upper Abdomen: No acute abnormality.

Musculoskeletal: No chest wall abnormality. No acute or significant
osseous findings.

Review of the MIP images confirms the above findings.
IMPRESSION: Normal CT angiogram of the chest.

## 2018-05-23 ENCOUNTER — Emergency Department (HOSPITAL_COMMUNITY)
Admission: EM | Admit: 2018-05-23 | Discharge: 2018-05-23 | Disposition: A | Discharge status: Home / Self Care | Payer: Self-pay | Attending: Emergency Medicine | Admitting: Emergency Medicine

## 2018-05-23 ENCOUNTER — Other Ambulatory Visit: Payer: Self-pay

## 2018-05-23 ENCOUNTER — Emergency Department (HOSPITAL_COMMUNITY): Payer: Self-pay

## 2018-05-23 DIAGNOSIS — R1319 Other dysphagia: Secondary | ICD-10-CM

## 2018-05-23 DIAGNOSIS — E119 Type 2 diabetes mellitus without complications: Secondary | ICD-10-CM | POA: Insufficient documentation

## 2018-05-23 DIAGNOSIS — R1310 Dysphagia, unspecified: Secondary | ICD-10-CM | POA: Insufficient documentation

## 2018-05-23 DIAGNOSIS — F17210 Nicotine dependence, cigarettes, uncomplicated: Secondary | ICD-10-CM | POA: Insufficient documentation

## 2018-05-23 DIAGNOSIS — R002 Palpitations: Secondary | ICD-10-CM | POA: Insufficient documentation

## 2018-05-23 DIAGNOSIS — Z79899 Other long term (current) drug therapy: Secondary | ICD-10-CM | POA: Insufficient documentation

## 2018-05-23 LAB — I-STAT TROPONIN, ED: Troponin i, poc: 0.01 ng/mL (ref 0.00–0.08)

## 2018-05-23 LAB — BASIC METABOLIC PANEL
Anion gap: 10 (ref 5–15)
BUN: 14 mg/dL (ref 6–20)
CO2: 20 mmol/L — ABNORMAL LOW (ref 22–32)
Calcium: 8.9 mg/dL (ref 8.9–10.3)
Chloride: 105 mmol/L (ref 98–111)
Creatinine, Ser: 1.07 mg/dL (ref 0.61–1.24)
GFR calc Af Amer: >60 mL/min (ref >60)
GFR calc non Af Amer: >60 mL/min (ref >60)
Glucose, Bld: 96 mg/dL (ref 70–99)
Potassium: 4.6 mmol/L (ref 3.5–5.1)
Sodium: 135 mmol/L (ref 135–145)

## 2018-05-23 LAB — CBC
HEMATOCRIT: 51.5 % (ref 39.0–52.0)
Hemoglobin: 16.2 g/dL (ref 13.0–17.0)
MCH: 27.2 pg (ref 26.0–34.0)
MCHC: 31.5 g/dL (ref 30.0–36.0)
MCV: 86.6 fL (ref 80.0–100.0)
Platelets: 315 10*3/uL (ref 150–400)
RBC: 5.95 MIL/uL — ABNORMAL HIGH (ref 4.22–5.81)
RDW: 14.4 % (ref 11.5–15.5)
WBC: 7.5 10*3/uL (ref 4.0–10.5)
nRBC: 0 % (ref 0.0–0.2)

## 2018-05-23 MED ORDER — ACETAMINOPHEN 500 MG PO TABS: 1000.0000 mg | ORAL_TABLET | Freq: Once | ORAL | Status: DC

## 2018-05-23 NOTE — Discharge Instructions (Signed)
Follow-up closely with your primary doctor and neurology for another opinion of your difficulty with swallowing. Return for new or worsening symptoms.

## 2018-05-23 NOTE — ED Triage Notes (Signed)
Pt endorses posterior headache, difficulty swallowing, and heart fluttering that began last night at midnight. Pt has had difficulty swallowing before and was admitted here for 9 days. VSS, Airway intact.

## 2018-05-23 NOTE — ED Provider Notes (Signed)
[facility] EMERGENCY DEPARTMENT Provider Note   CSN: [id number] Arrival date & time: 05/23/18  1018     History   Chief Complaint Chief Complaint  Patient presents with  . Dysphagia  . Headache  . Palpitations    HPI Brady Gibbs is a 38 y.o. male.  Patient presents with gradual onset posterior headache, heart palpitations intermittent since yesterday.  Patient's had difficulty swallowing for months and was admitted for 9 days for further work-up.  Patient also with sleep apnea and is noticed he is having more difficulty with that recently.  No fevers or chills.  No chest pain.  Patient has mild shortness of breath is similar to previous that he has been worked up for.  No recent surgery, no blood clot history, no unilateral leg swelling.  Currently no neurologic symptoms.     Past Medical History:  Diagnosis Date  . Diabetes mellitus without complication (HCC)   . DKA (diabetic ketoacidoses) (HCC) 10/10/2016  . OSA on CPAP     Patient Active Problem List   Diagnosis Date Noted  . Dysphagia   . Abnormal esophagram   . DKA (diabetic ketoacidoses) (HCC) 01/01/2015  . Candidiasis   . Diabetic ketoacidosis without coma associated with type 2 diabetes mellitus (HCC)   . Metabolic acidosis   . Insomnia 08/10/2014  . Onychomycosis of right great toe 08/10/2014  . Body mass index (BMI) of 40.0-44.9 in adult (HCC) 08/10/2014  . Hyperlipidemia 09/28/2012  . Condyloma acuminata 09/27/2012  . Severe obstructive sleep apnea 06/15/2012  . Diabetes mellitus, type 2 (HCC) 04/15/2012    Past Surgical History:  Procedure Laterality Date  . ESOPHAGOGASTRODUODENOSCOPY N/A 10/15/2016   Procedure: ESOPHAGOGASTRODUODENOSCOPY (EGD);  Surgeon: Meryl Dare, MD;  Location: [facility] ENDOSCOPY;  Service: Endoscopy;  Laterality: N/A;  . FOOT SURGERY Right  at age 52   cyst removal  . HERNIA REPAIR     umbical hernia repair at 1 or 38 years old.        Home Medications     Prior to Admission medications   Medication Sig Start Date End Date Taking? Authorizing Provider  metFORMIN (GLUCOPHAGE) 500 MG tablet Take 500 mg by mouth daily as needed (for high blood sugar).    Yes [provider]  insulin aspart protamine- aspart (NOVOLOG MIX 70/30) (70-30) 100 UNIT/ML injection Inject 0.35 mLs (35 Units total) into the skin 2 (two) times daily with a meal. Patient not taking: Reported on 05/23/2018 10/16/16   Rolly Salter, MD  omeprazole (PRILOSEC) 40 MG capsule Take 1 capsule (40 mg total) by mouth 2 (two) times daily. Patient not taking: Reported on 05/23/2018 10/16/16   Rolly Salter, MD  sucralfate (CARAFATE) 1 GM/10ML suspension Take 10 mLs (1 g total) by mouth 4 (four) times daily -  with meals and at bedtime. Patient not taking: Reported on 05/23/2018 10/16/16   Rolly Salter, MD    Family History Family History  Problem Relation Age of Onset  . Heart failure Maternal Grandmother   . CAD Neg Hx        mother    Social History Social History   Tobacco Use  . Smoking status: Current Every Day Smoker    Packs/day: 0.50    Types: Cigarettes  . Smokeless tobacco: Never Used  Substance Use Topics  . Alcohol use: No    Alcohol/week: 0.0 standard drinks  . Drug use: No     Allergies   Patient  has no known allergies.   Review of Systems Review of Systems  Constitutional: Negative for chills and fever.  HENT: Positive for trouble swallowing. Negative for congestion.   Eyes: Negative for visual disturbance.  Respiratory: Positive for shortness of breath.   Cardiovascular: Negative for chest pain.  Gastrointestinal: Negative for abdominal pain and vomiting.  Genitourinary: Negative for dysuria and flank pain.  Musculoskeletal: Negative for back pain, neck pain and neck stiffness.  Skin: Negative for rash.  Neurological: Negative for weakness, light-headedness, numbness and headaches.     Physical Exam Updated Vital Signs BP  123/83 (BP Location: Right Arm)   Pulse 77   Temp 98 F (36.7 C) (Oral)   Resp 18   Ht 6\' 1"  (1.854 m)   Wt 115.7 kg   SpO2 98%   BMI 33.64 kg/m   Physical Exam Vitals signs and nursing note reviewed.  Constitutional:      Appearance: He is well-developed.  HENT:     Head: Normocephalic and atraumatic.  Eyes:     General:        Right eye: No discharge.        Left eye: No discharge.     Conjunctiva/sclera: Conjunctivae normal.  Neck:     Musculoskeletal: Normal range of motion and neck supple.     Trachea: No tracheal deviation.  Cardiovascular:     Rate and Rhythm: Normal rate and regular rhythm.  Pulmonary:     Effort: Pulmonary effort is normal.     Breath sounds: Normal breath sounds.  Abdominal:     General: There is no distension.     Palpations: Abdomen is soft.     Tenderness: There is no abdominal tenderness. There is no guarding.  Skin:    General: Skin is warm.     Findings: No rash.  Neurological:     Mental Status: He is alert and oriented to person, place, and time.     GCS: GCS eye subscore is 4. GCS verbal subscore is 5. GCS motor subscore is 6.     Cranial Nerves: Cranial nerves are intact.     Sensory: Sensation is intact.     Motor: Motor function is intact.  Psychiatric:        Mood and Affect: Mood normal.      ED Treatments / Results  Labs (all labs ordered are listed, but only abnormal results are displayed) Labs Reviewed  BASIC METABOLIC PANEL - Abnormal; Notable for the following components:      Result Value   CO2 20 (*)    All other components within normal limits  CBC - Abnormal; Notable for the following components:   RBC 5.95 (*)    All other components within normal limits  I-STAT TROPONIN, ED    EKG EKG Interpretation  Date/Time:  Monday May 23 2018 10:24:58 EST Ventricular Rate:  87 PR Interval:  142 QRS Duration: 74 QT Interval:  330 QTC Calculation: 397 R Axis:   116 Text Interpretation:  Normal sinus  rhythm Right axis deviation Abnormal ECG Confirmed by Blane OharaZavitz, Zuma Hust [phone]) on 05/23/2018 11:49:40 AM   Radiology Dg Chest 2 View  Result Date: 05/23/2018 CLINICAL DATA:  Chest pain EXAM: CHEST - 2 VIEW COMPARISON:  10/29/2017 FINDINGS: Normal heart size and mediastinal contours. No acute infiltrate or edema. Mildly low lung volumes no effusion or pneumothorax. No acute osseous findings. IMPRESSION: No evidence of active disease. Electronically Signed   By: Kathrynn DuckingJonathon  Watts M.D.  On: 05/23/2018 11:14    Procedures Procedures (including critical care time)  Medications Ordered in ED Medications  acetaminophen (TYLENOL) tablet 1,000 mg (has no administration in time range)     Initial Impression / Assessment and Plan / ED Course  I have reviewed the triage vital signs and the nursing notes.  Pertinent labs & imaging results that were available during my care of the patient were reviewed by me and considered in my medical decision making (see chart for details).    Patient presents with palpitations and dysphasia.  Patient has had evaluation for this however feels it is not improved.  Patient has normal neurologic exam in the ER.  No current chest pain.  Discussed patient will need continued follow-up with primary doctor and also neurology for other expertise.  Discussed reasons to return.  Patient's EKG was similar to previous.  Chest x-ray no acute findings.  Patient well-appearing in the ER.  Patient had screening blood work which was unremarkable and troponin which was negative, no active chest pain at this time.  Results and differential diagnosis were discussed with the patient/parent/guardian. Xrays were independently reviewed by myself.  Close follow up outpatient was discussed, comfortable with the plan.   Medications  acetaminophen (TYLENOL) tablet 1,000 mg (has no administration in time range)    Vitals:   05/23/18 1023 05/23/18 1024 05/23/18 1148  BP: 123/83    Pulse: 70   77  Resp: 16  18  Temp: 98 F (36.7 C)    TempSrc: Oral    SpO2: 98%  98%  Weight:  115.7 kg   Height:  6\' 1"  (1.854 m)     Final diagnoses:  Esophageal dysphagia  Palpitations     Final Clinical Impressions(s) / ED Diagnoses   Final diagnoses:  Esophageal dysphagia  Palpitations    ED Discharge Orders    None       Blane Ohara, MD 05/23/18 1317

## 2018-05-23 NOTE — ED Notes (Signed)
Patient verbalizes understanding of discharge instructions. Opportunity for questioning and answers were provided. 

## 2018-08-02 ENCOUNTER — Other Ambulatory Visit: Payer: Self-pay

## 2018-08-02 ENCOUNTER — Emergency Department (HOSPITAL_COMMUNITY)
Admission: EM | Admit: 2018-08-02 | Discharge: 2018-08-02 | Disposition: A | Discharge status: Home / Self Care | Payer: Self-pay | Attending: Emergency Medicine | Admitting: Emergency Medicine

## 2018-08-02 ENCOUNTER — Encounter (HOSPITAL_COMMUNITY): Payer: Self-pay

## 2018-08-02 DIAGNOSIS — R05 Cough: Secondary | ICD-10-CM | POA: Insufficient documentation

## 2018-08-02 DIAGNOSIS — F17210 Nicotine dependence, cigarettes, uncomplicated: Secondary | ICD-10-CM | POA: Insufficient documentation

## 2018-08-02 DIAGNOSIS — E119 Type 2 diabetes mellitus without complications: Secondary | ICD-10-CM | POA: Insufficient documentation

## 2018-08-02 DIAGNOSIS — R059 Cough, unspecified: Secondary | ICD-10-CM

## 2018-08-02 DIAGNOSIS — K30 Functional dyspepsia: Secondary | ICD-10-CM | POA: Insufficient documentation

## 2018-08-02 DIAGNOSIS — Z7984 Long term (current) use of oral hypoglycemic drugs: Secondary | ICD-10-CM | POA: Insufficient documentation

## 2018-08-02 HISTORY — DX: Gastro-esophageal reflux disease without esophagitis: K21.9

## 2018-08-02 MED ORDER — OMEPRAZOLE 40 MG PO CPDR: 40.0000 mg | DELAYED_RELEASE_CAPSULE | Freq: Every day | ORAL | 0 refills | Status: AC

## 2018-08-02 MED ORDER — BENZONATATE 100 MG PO CAPS: 100.0000 mg | ORAL_CAPSULE | Freq: Three times a day (TID) | ORAL | 0 refills | Status: DC

## 2018-08-02 NOTE — ED Triage Notes (Signed)
Pt reports epigastric abdominal pain for 1-2weeks.  Pt reports feeling as if when he swallows that it doesn't go down or "feels different going down." Pt has hx of GERD and endoscopy and was admitted.  Pt reports some coughing and SOB, denies fever, vomiting and diarrhea.

## 2018-08-02 NOTE — ED Provider Notes (Signed)
[facility] EMERGENCY DEPARTMENT Provider Note   CSN: [id number] Arrival date & time: 08/02/18  1412    History   Chief Complaint Chief Complaint  Patient presents with  . Abdominal Pain    HPI Brady Gibbs is a 38 y.o. male.     HPI   38 year old male presents today with complaints of indigestion.  Patient notes approximately 2 weeks ago he developed worsening indigestion with reflux up into his throat, burning sensation, pain in his epigastric region.  Patient notes that occasionally he has uncomfortable sensation in his throat and upper abdomen with swallowing.  He notes he was able to eat tacos last night without difficulty.  He denies any vomiting.  No fever.  He also notes that he has had a minor cough over the last week nonproductive, no associated shortness of breath.  He notes he is a smoker.  He denies any close sick contacts, recent travel or known COVID-19 contacts.  Denies any chest pain.   Past Medical History:  Diagnosis Date  . Diabetes mellitus without complication (HCC)   . DKA (diabetic ketoacidoses) (HCC) 10/10/2016  . GERD (gastroesophageal reflux disease)   . OSA on CPAP     Patient Active Problem List   Diagnosis Date Noted  . Dysphagia   . Abnormal esophagram   . DKA (diabetic ketoacidoses) (HCC) 01/01/2015  . Candidiasis   . Diabetic ketoacidosis without coma associated with type 2 diabetes mellitus (HCC)   . Metabolic acidosis   . Insomnia 08/10/2014  . Onychomycosis of right great toe 08/10/2014  . Body mass index (BMI) of 40.0-44.9 in adult (HCC) 08/10/2014  . Hyperlipidemia 09/28/2012  . Condyloma acuminata 09/27/2012  . Severe obstructive sleep apnea 06/15/2012  . Diabetes mellitus, type 2 (HCC) 04/15/2012    Past Surgical History:  Procedure Laterality Date  . ESOPHAGOGASTRODUODENOSCOPY N/A 10/15/2016   Procedure: ESOPHAGOGASTRODUODENOSCOPY (EGD);  Surgeon: Meryl Dare, MD;  Location: [facility] ENDOSCOPY;  Service:  Endoscopy;  Laterality: N/A;  . FOOT SURGERY Right  at age 52   cyst removal  . HERNIA REPAIR     umbical hernia repair at 89 or 38 years old.        Home Medications    Prior to Admission medications   Medication Sig Start Date End Date Taking? Authorizing Provider  benzonatate (TESSALON) 100 MG capsule Take 1 capsule (100 mg total) by mouth every 8 (eight) hours. 08/02/18   Dimarco Minkin, Tinnie Gens, PA-C  insulin aspart protamine- aspart (NOVOLOG MIX 70/30) (70-30) 100 UNIT/ML injection Inject 0.35 mLs (35 Units total) into the skin 2 (two) times daily with a meal. Patient not taking: Reported on 05/23/2018 10/16/16   Rolly Salter, MD  metFORMIN (GLUCOPHAGE) 500 MG tablet Take 500 mg by mouth daily as needed (for high blood sugar).     [provider]  omeprazole (PRILOSEC) 40 MG capsule Take 1 capsule (40 mg total) by mouth daily. 08/02/18   Brondon Wann, Tinnie Gens, PA-C  sucralfate (CARAFATE) 1 GM/10ML suspension Take 10 mLs (1 g total) by mouth 4 (four) times daily -  with meals and at bedtime. Patient not taking: Reported on 05/23/2018 10/16/16   Rolly Salter, MD    Family History Family History  Problem Relation Age of Onset  . Heart failure Maternal Grandmother   . CAD Neg Hx        mother    Social History Social History   Tobacco Use  . Smoking status: Current Every Day  Smoker    Packs/day: 0.50    Types: Cigarettes  . Smokeless tobacco: Never Used  Substance Use Topics  . Alcohol use: No    Alcohol/week: 0.0 standard drinks  . Drug use: No     Allergies   Patient has no known allergies.   Review of Systems Review of Systems  All other systems reviewed and are negative.    Physical Exam Updated Vital Signs BP 132/89 (BP Location: Right Arm)   Pulse (!) 106   Temp 98.7 F (37.1 C) (Oral)   Resp 18   Ht 6\' 1"  (1.854 m)   Wt 115.7 kg   SpO2 97%   BMI 33.64 kg/m   Physical Exam Vitals signs and nursing note reviewed.  Constitutional:      Appearance:  He is well-developed.  HENT:     Head: Normocephalic and atraumatic.     Comments: Oropharynx clear no erythema edema or exudate Eyes:     General: No scleral icterus.       Right eye: No discharge.        Left eye: No discharge.     Conjunctiva/sclera: Conjunctivae normal.     Pupils: Pupils are equal, round, and reactive to light.  Neck:     Musculoskeletal: Normal range of motion.     Vascular: No JVD.     Trachea: No tracheal deviation.  Pulmonary:     Effort: Pulmonary effort is normal.     Breath sounds: No stridor.  Abdominal:     Comments: Minimal tenderness to palpation of the epigastric region remainder abdomen soft nontender  Neurological:     Mental Status: He is alert and oriented to person, place, and time.     Coordination: Coordination normal.  Psychiatric:        Behavior: Behavior normal.        Thought Content: Thought content normal.        Judgment: Judgment normal.      ED Treatments / Results  Labs (all labs ordered are listed, but only abnormal results are displayed) Labs Reviewed - No data to display  EKG None  Radiology No results found.  Procedures Procedures (including critical care time)  Medications Ordered in ED Medications - No data to display   Initial Impression / Assessment and Plan / ED Course  I have reviewed the triage vital signs and the nursing notes.  Pertinent labs & imaging results that were available during my care of the patient were reviewed by me and considered in my medical decision making (see chart for details).        Labs:   Imaging:  Consults:  Therapeutics:  Discharge Meds: omeprazole, tessalon   Assessment/Plan: 38 year old male presents today with likely indigestion question esophagitis.  Patient is tolerating p.o., no pooling of secretions, he is able to eat solid foods.  Patient has no associated chest pain, suspicion for acute intra-abdominal pathology including infection.  Patient is also  having a minor cough he has no respiratory distress, he has clear lung sounds.  Patient is afebrile low suspicion for influenza, pneumonia, or COVID-19.  Patient stable for outpatient GI follow-up, strict return precautions given.  He verbalized understanding and agreement to today's plan had no further questions or concerns at time of discharge.    Final Clinical Impressions(s) / ED Diagnoses   Final diagnoses:  Indigestion  Cough    ED Discharge Orders         Ordered  omeprazole (PRILOSEC) 40 MG capsule  Daily     08/02/18 1456    benzonatate (TESSALON) 100 MG capsule  Every 8 hours     08/02/18 1457           HedgesTinnie Gens, Jomaira Darr, PA-C 08/02/18 1457    Shaune PollackIsaacs, Cameron, MD 08/03/18 [phone]

## 2018-08-02 NOTE — Discharge Instructions (Addendum)
Please read attached information. If you experience any new or worsening signs or symptoms please return to the emergency room for evaluation. Please follow-up with your primary care provider or specialist as discussed. Please use medication prescribed only as directed and discontinue taking if you have any concerning signs or symptoms.   °

## 2018-08-17 NOTE — Progress Notes (Deleted)
Patient ID: Brady Gibbs, male   DOB: 13-Oct-1980, 38 y.o.   MRN: [id number]   After being seen in the ED for indigestion 08/02/2018.    From ED note: 38 year old male presents today with likely indigestion question esophagitis.  Patient is tolerating p.o., no pooling of secretions, he is able to eat solid foods.  Patient has no associated chest pain, suspicion for acute intra-abdominal pathology including infection.  Patient is also having a minor cough he has no respiratory distress, he has clear lung sounds.  Patient is afebrile low suspicion for influenza, pneumonia, or COVID-19.  Patient stable for outpatient GI follow-up, strict return precautions given.  He verbalized understanding and agreement to today's plan had no further questions or concerns at time of discharge.

## 2018-08-18 ENCOUNTER — Inpatient Hospital Stay: Payer: Self-pay

## 2018-10-03 ENCOUNTER — Encounter (HOSPITAL_COMMUNITY): Payer: Self-pay | Admitting: Emergency Medicine

## 2018-10-03 ENCOUNTER — Other Ambulatory Visit: Payer: Self-pay

## 2018-10-03 ENCOUNTER — Emergency Department (HOSPITAL_COMMUNITY)
Admission: EM | Admit: 2018-10-03 | Discharge: 2018-10-03 | Disposition: A | Discharge status: Home / Self Care | Payer: Self-pay | Attending: Emergency Medicine | Admitting: Emergency Medicine

## 2018-10-03 DIAGNOSIS — F17210 Nicotine dependence, cigarettes, uncomplicated: Secondary | ICD-10-CM | POA: Insufficient documentation

## 2018-10-03 DIAGNOSIS — E119 Type 2 diabetes mellitus without complications: Secondary | ICD-10-CM | POA: Insufficient documentation

## 2018-10-03 DIAGNOSIS — L0291 Cutaneous abscess, unspecified: Secondary | ICD-10-CM

## 2018-10-03 DIAGNOSIS — Z79899 Other long term (current) drug therapy: Secondary | ICD-10-CM | POA: Insufficient documentation

## 2018-10-03 DIAGNOSIS — Z7984 Long term (current) use of oral hypoglycemic drugs: Secondary | ICD-10-CM | POA: Insufficient documentation

## 2018-10-03 DIAGNOSIS — L02414 Cutaneous abscess of left upper limb: Secondary | ICD-10-CM | POA: Insufficient documentation

## 2018-10-03 MED ORDER — LIDOCAINE-EPINEPHRINE (PF) 2 %-1:200000 IJ SOLN
20.0000 mL | Freq: Once | INTRAMUSCULAR | Status: AC
  Administered 2018-10-03: 20 mL
  Filled 2018-10-03: qty 20

## 2018-10-03 MED ORDER — DOXYCYCLINE HYCLATE 100 MG PO CAPS: 100.0000 mg | ORAL_CAPSULE | Freq: Two times a day (BID) | ORAL | 0 refills | Status: DC

## 2018-10-03 NOTE — Discharge Instructions (Addendum)
Please read attached information. If you experience any new or worsening signs or symptoms please return to the emergency room for evaluation. Please follow-up with your primary care provider or specialist as discussed. Please use medication prescribed only as directed and discontinue taking if you have any concerning signs or symptoms.   °

## 2018-10-03 NOTE — ED Triage Notes (Signed)
Pt reports an abscess on the left posterior shoulder.

## 2018-10-03 NOTE — ED Provider Notes (Signed)
[facility] EMERGENCY [facility] Provider Note   CSN: [id number] Arrival date & time: 10/03/18  1448   History   Chief Complaint Chief Complaint  Patient presents with  . Abscess    top of left shoulder    HPI Brady Loseeso Gibbs is a 38 y.o. male.    HPI   38 year old male presents today with complaints of abscess.  Patient notes 2 days ago he developed 2 nodules to his left upper trapezius.  He notes these worsen, he was able to express a small amount of pus.  He notes pain.  He denies any fever.   Past Medical History:  Diagnosis Date  . Diabetes mellitus without complication (HCC)   . DKA (diabetic ketoacidoses) (HCC) 10/10/2016  . GERD (gastroesophageal reflux disease)   . OSA on CPAP     Patient Active Problem List   Diagnosis Date Noted  . Dysphagia   . Abnormal esophagram   . DKA (diabetic ketoacidoses) (HCC) 01/01/2015  . Candidiasis   . Diabetic ketoacidosis without coma associated with type 2 diabetes mellitus (HCC)   . Metabolic acidosis   . Insomnia 08/10/2014  . Onychomycosis of right great toe 08/10/2014  . Body mass index (BMI) of 40.0-44.9 in adult (HCC) 08/10/2014  . Hyperlipidemia 09/28/2012  . Condyloma acuminata 09/27/2012  . Severe obstructive sleep apnea 06/15/2012  . Diabetes mellitus, type 2 (HCC) 04/15/2012    Past Surgical History:  Procedure Laterality Date  . ESOPHAGOGASTRODUODENOSCOPY N/A 10/15/2016   Procedure: ESOPHAGOGASTRODUODENOSCOPY (EGD);  Surgeon: Meryl DareStark, Malcolm T, MD;  [facility];  Service: Endoscopy;  Laterality: N/A;  . FOOT SURGERY Right  at age 38   cyst removal  . HERNIA REPAIR     umbical hernia repair at 89 or 38 years old.        Home Medications    Prior to Admission medications   Medication Sig Start Date End Date Taking? Authorizing Provider  benzonatate (TESSALON) 100 MG capsule Take 1 capsule (100 mg total) by mouth every 8 (eight) hours. 08/02/18   Dae Antonucci, Tinnie GensJeffrey, PA-C  doxycycline  (VIBRAMYCIN) 100 MG capsule Take 1 capsule (100 mg total) by mouth 2 (two) times daily. 10/03/18   Cleven Jansma, Tinnie GensJeffrey, PA-C  insulin aspart protamine- aspart (NOVOLOG MIX 70/30) (70-30) 100 UNIT/ML injection Inject 0.35 mLs (35 Units total) into the skin 2 (two) times daily with a meal. Patient not taking: Reported on 05/23/2018 10/16/16   Rolly SalterPatel, Pranav M, MD  metFORMIN (GLUCOPHAGE) 500 MG tablet Take 500 mg by mouth daily as needed (for high blood sugar).     [provider]  omeprazole (PRILOSEC) 40 MG capsule Take 1 capsule (40 mg total) by mouth daily. 08/02/18   Geral Tuch, Tinnie GensJeffrey, PA-C  sucralfate (CARAFATE) 1 GM/10ML suspension Take 10 mLs (1 g total) by mouth 4 (four) times daily -  with meals and at bedtime. Patient not taking: Reported on 05/23/2018 10/16/16   Rolly SalterPatel, Pranav M, MD    Family History Family History  Problem Relation Age of Onset  . Heart failure Maternal Grandmother   . CAD Neg Hx        mother    Social History Social History   Tobacco Use  . Smoking status: Current Every Day Smoker    Packs/day: 0.50    Types: Cigarettes  . Smokeless tobacco: Never Used  Substance Use Topics  . Alcohol use: No    Alcohol/week: 0.0 standard drinks  . Drug use: No  Allergies   Patient has no known allergies.   Review of Systems Review of Systems  All other systems reviewed and are negative.    Physical Exam Updated Vital Signs BP 134/88 (BP Location: Right Arm)   Pulse (!) 112   Temp 98.3 F (36.8 C) (Oral)   Resp 16   Ht 6\' 1"  (1.854 m)   Wt 117.9 kg   SpO2 97%   BMI 34.30 kg/m   Physical Exam Vitals signs and nursing note reviewed.  Constitutional:      Appearance: He is well-developed.  HENT:     Head: Normocephalic and atraumatic.  Eyes:     General: No scleral icterus.       Right eye: No discharge.        Left eye: No discharge.     Conjunctiva/sclera: Conjunctivae normal.     Pupils: Pupils are equal, round, and reactive to light.  Neck:      Musculoskeletal: Normal range of motion.     Vascular: No JVD.     Trachea: No tracheal deviation.  Pulmonary:     Effort: Pulmonary effort is normal.     Breath sounds: No stridor.  Musculoskeletal:     Comments: 3 cm area of induration to the left trapezius with several areas of central fluctuance-minor overlying erythema, no surrounding erythema other than where indurated-no purulence  Neurological:     Mental Status: He is alert and oriented to person, place, and time.     Coordination: Coordination normal.  Psychiatric:        Behavior: Behavior normal.        Thought Content: Thought content normal.        Judgment: Judgment normal.      ED Treatments / Results  Labs (all labs ordered are listed, but only abnormal results are displayed) Labs Reviewed - No data to display  EKG None  Radiology No results found.   Procedures .Marland KitchenIncision and Drainage Date/Time: 10/03/2018 7:33 PM Performed by: Okey Regal, PA-C Authorized by: Okey Regal, PA-C   Consent:    Consent obtained:  Verbal   Consent given by:  Patient   Risks discussed:  Bleeding, damage to other organs, incomplete drainage, pain and infection   Alternatives discussed:  No treatment and alternative treatment Location:    Type:  Abscess   Size:  3   Location: Left upper back. Pre-procedure details:    Skin preparation:  Chloraprep Anesthesia (see MAR for exact dosages):    Anesthesia method:  Local infiltration   Local anesthetic:  Lidocaine 2% WITH epi Procedure type:    Complexity:  Simple Procedure details:    Incision types:  Single straight (3 small straight incisions )   Scalpel blade:  11   Wound management:  Probed and deloculated and irrigated with saline   Drainage:  Purulent   Drainage amount:  Moderate   Wound treatment:  Wound left open   Packing materials:  None Post-procedure details:    Patient tolerance of procedure:  Tolerated well, no immediate complications    (including critical care time)  Medications Ordered in ED Medications  lidocaine-EPINEPHrine (XYLOCAINE W/EPI) 2 %-1:200000 (PF) injection 20 mL (20 mLs Infiltration Given 10/03/18 1730)     Initial Impression / Assessment and Plan / ED Course  I have reviewed the triage vital signs and the nursing notes.  Pertinent labs & imaging results that were available during my care of the patient were reviewed by me and considered  in my medical decision making (see chart for details).         Assessment/Plan: 38 year old male presents today with several infected sebaceous cyst.  I was able to remove some of the capsule onto the cyst.  I did make 3 small incisions all with purulent drainage.  Patient tolerated procedure well with no complications.No signs of systemic symptoms.     Final Clinical Impressions(s) / ED Diagnoses   Final diagnoses:  Abscess    ED Discharge Orders         Ordered    doxycycline (VIBRAMYCIN) 100 MG capsule  2 times daily     10/03/18 1801           Eyvonne MechanicHedges, Kaleah Hagemeister, PA-C 10/03/18 [id number]    Yelverton, David, MD 10/08/18 [phone]

## 2018-10-03 NOTE — ED Notes (Signed)
Patient verbalizes understanding of discharge instructions. Opportunity for questioning and answers were provided. Armband removed by staff, pt discharged from ED. Ambulated out to lobby  

## 2019-02-02 IMAGING — DX DG CHEST 2V
2 series · 2 of 2 positions shown · non-contrast
Comparison: Radiograph October 10, 2016.

CLINICAL DATA: Shortness of breath.

EXAM:
CHEST - 2 VIEW

[chest pa]
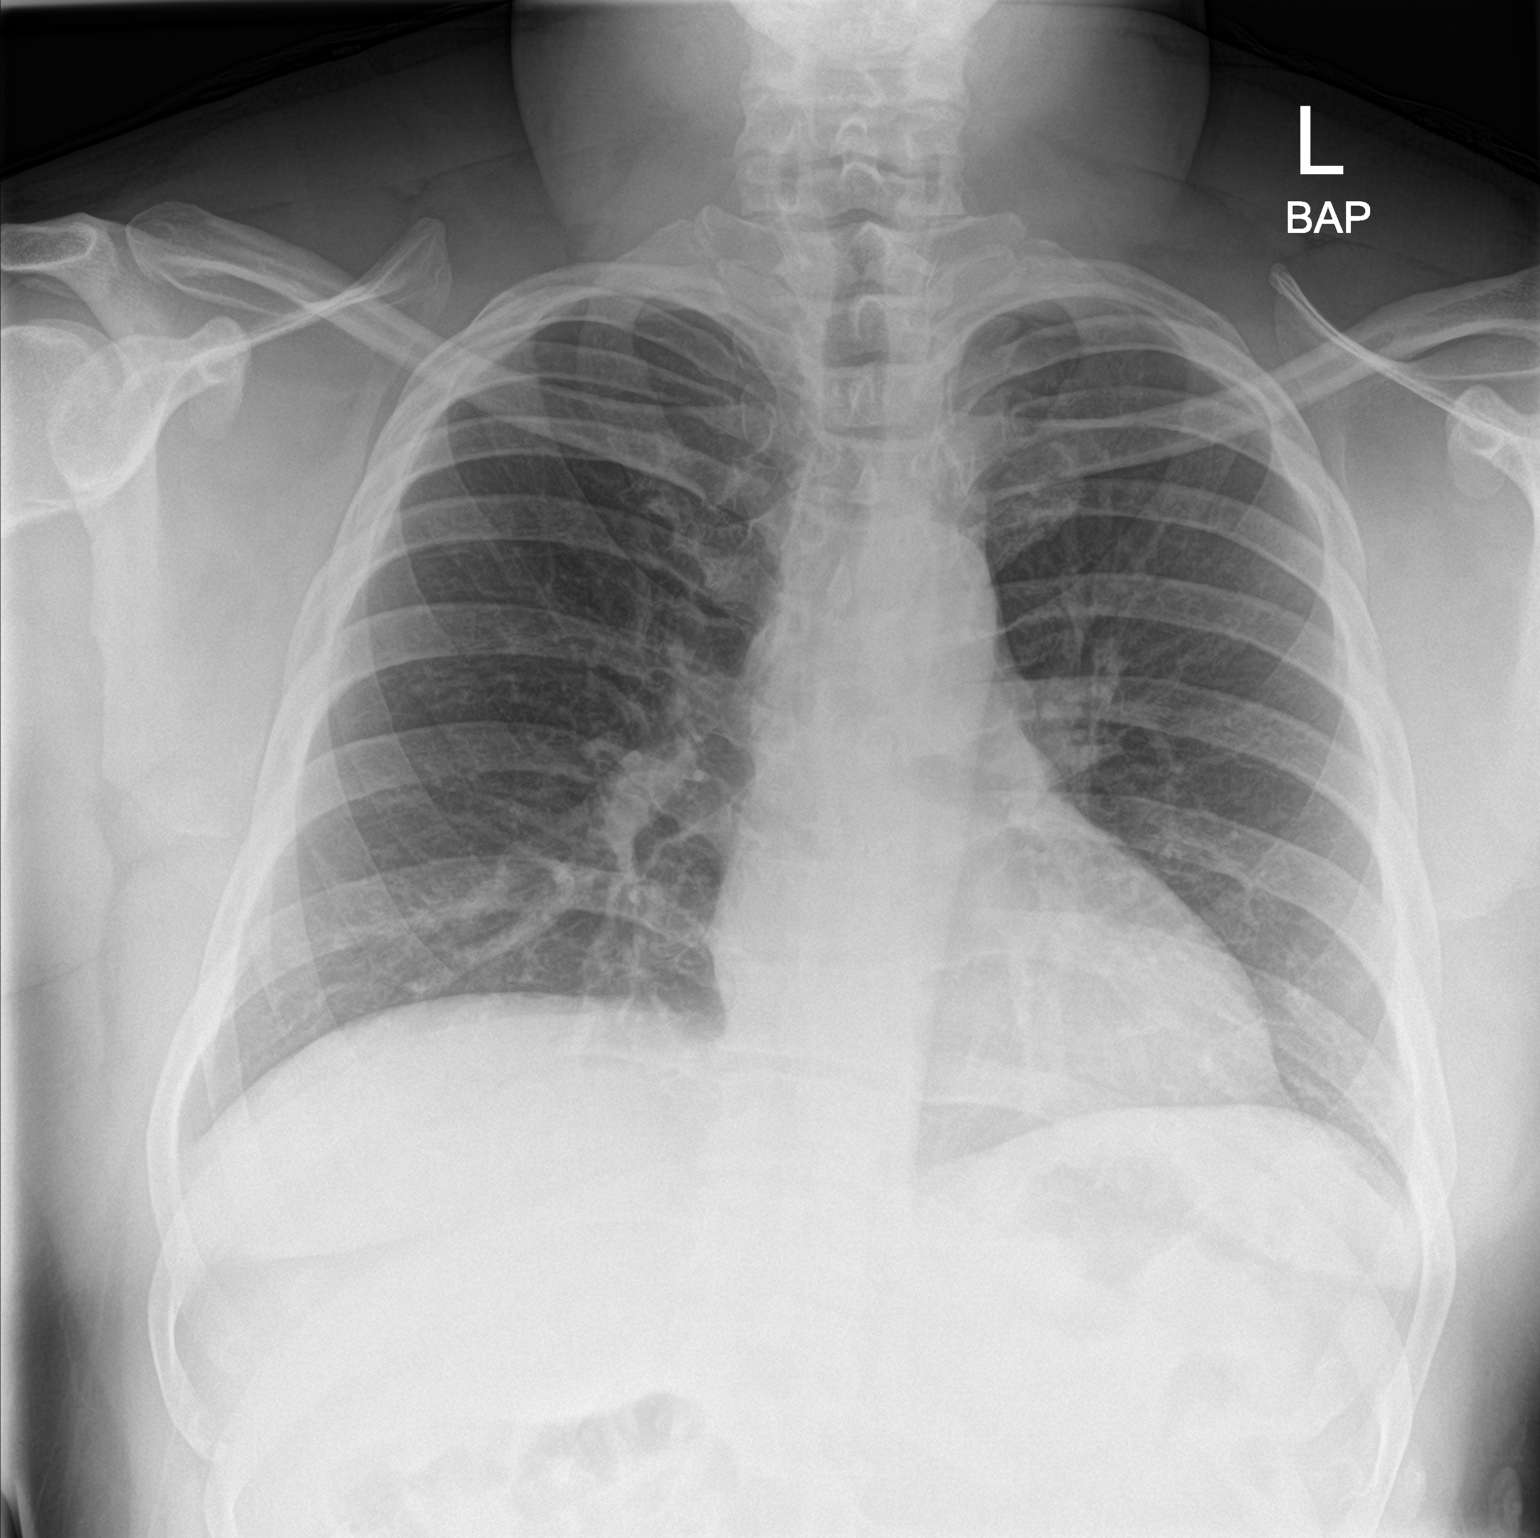

[chest lat]
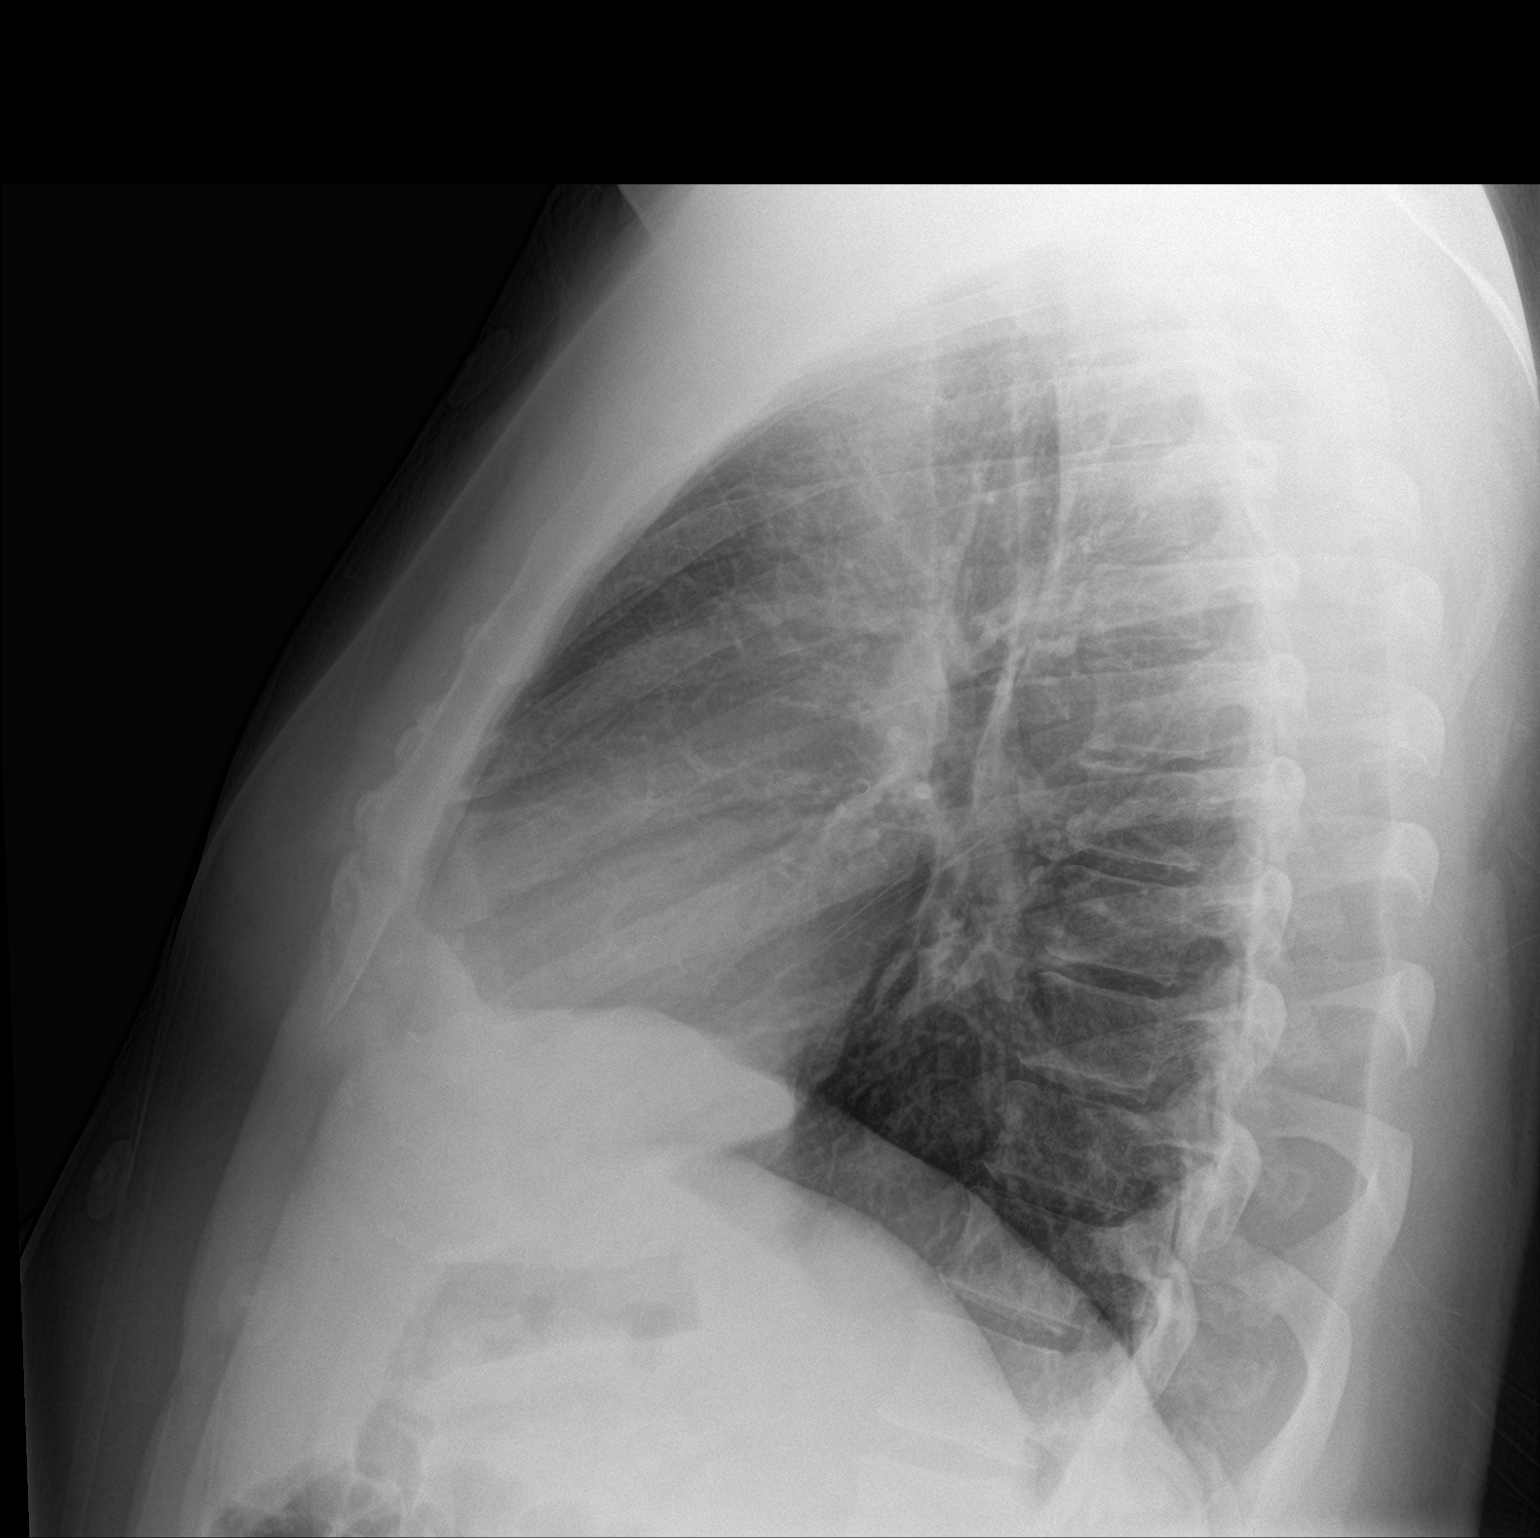

[2 of 2 positions shown; findings below may reference images not displayed]

FINDINGS: The heart size and mediastinal contours are within normal limits.
Both lungs are clear. No pneumothorax or pleural effusion is noted.
The visualized skeletal structures are unremarkable.
IMPRESSION: No active cardiopulmonary disease.

## 2021-05-23 ENCOUNTER — Emergency Department (HOSPITAL_COMMUNITY)
Admission: EM | Admit: 2021-05-23 | Discharge: 2021-05-23 | Disposition: A | Discharge status: Home / Self Care | Payer: Commercial Managed Care - HMO | Attending: Emergency Medicine | Admitting: Emergency Medicine

## 2021-05-23 ENCOUNTER — Other Ambulatory Visit: Payer: Self-pay

## 2021-05-23 DIAGNOSIS — H60501 Unspecified acute noninfective otitis externa, right ear: Secondary | ICD-10-CM | POA: Insufficient documentation

## 2021-05-23 DIAGNOSIS — H9201 Otalgia, right ear: Secondary | ICD-10-CM | POA: Diagnosis present

## 2021-05-23 MED ORDER — OFLOXACIN 0.3 % OT SOLN
5.0000 [drp] | Freq: Two times a day (BID) | OTIC | 0 refills | Status: AC
  Filled 2021-05-23: qty 5, 10d supply, fill #0

## 2021-05-23 NOTE — ED Provider Notes (Signed)
[facility] EMERGENCY DEPARTMENT Provider Note   CSN: [id number] Arrival date & time: 05/23/21  1700     History  Chief Complaint  Patient presents with   Otalgia    Brady Gibbs is a 41 y.o. male with 2-day history of right-sided ear pain.  He states the ear pain is constant.  Is been trying over-the-counter drops without any improvement.  Been having subjective fever and chills.  Having referred pain down to the neck.  No trouble swallowing.  No congestion.  No sore throat.   Otalgia     Home Medications Prior to Admission medications   Medication Sig Start Date End Date Taking? Authorizing Provider  ofloxacin (FLOXIN) 0.3 % OTIC solution Place 5 drops into the right ear 2 (two) times daily. 05/23/21  Yes Tahlor Berenguer M, PA-C  benzonatate (TESSALON) 100 MG capsule Take 1 capsule (100 mg total) by mouth every 8 (eight) hours. 08/02/18   Hedges, Tinnie Gens, PA-C  doxycycline (VIBRAMYCIN) 100 MG capsule Take 1 capsule (100 mg total) by mouth 2 (two) times daily. 10/03/18   Hedges, Tinnie Gens, PA-C  insulin aspart protamine- aspart (NOVOLOG MIX 70/30) (70-30) 100 UNIT/ML injection Inject 0.35 mLs (35 Units total) into the skin 2 (two) times daily with a meal. Patient not taking: Reported on 05/23/2018 10/16/16   Rolly Salter, MD  metFORMIN (GLUCOPHAGE) 500 MG tablet Take 500 mg by mouth daily as needed (for high blood sugar).     [provider]  omeprazole (PRILOSEC) 40 MG capsule Take 1 capsule (40 mg total) by mouth daily. 08/02/18   Hedges, Tinnie Gens, PA-C  sucralfate (CARAFATE) 1 GM/10ML suspension Take 10 mLs (1 g total) by mouth 4 (four) times daily -  with meals and at bedtime. Patient not taking: Reported on 05/23/2018 10/16/16   Rolly Salter, MD      Allergies    Patient has no known allergies.    Review of Systems   Review of Systems  HENT:  Positive for ear pain.   All other systems reviewed and are negative.  Physical Exam Updated Vital  Signs BP 120/83 (BP Location: Right Arm)    Pulse 90    Temp 99.6 F (37.6 C) (Oral)    Resp 16    Ht 6\' 1"  (1.854 m)    Wt 117.9 kg    SpO2 98%    BMI 34.30 kg/m  Physical Exam Vitals and nursing note reviewed.  Constitutional:      Appearance: Normal appearance.  HENT:     Head: Normocephalic and atraumatic.     Left Ear: Tympanic membrane, ear canal and external ear normal.     Ears:     Comments: Right external auditory canal is erythematous and edematous.  Difficult to fully visualize the TM but the portions I can see did not show any signs of perforation.  Positive tug test. No battle sign bilaterally.  Eyes:     General:        Right eye: No discharge.        Left eye: No discharge.     Conjunctiva/sclera: Conjunctivae normal.  Pulmonary:     Effort: Pulmonary effort is normal.  Skin:    General: Skin is warm and dry.     Findings: No rash.  Neurological:     General: No focal deficit present.     Mental Status: He is alert.  Psychiatric:        Mood and Affect: Mood  normal.        Behavior: Behavior normal.    ED Results / Procedures / Treatments   Labs (all labs ordered are listed, but only abnormal results are displayed) Labs Reviewed - No data to display  EKG None  Radiology No results found.  Procedures Procedures    Medications Ordered in ED Medications - No data to display  ED Course/ Medical Decision Making/ A&P                           Medical Decision Making  Damien Batty is a 41 y.o. male who presents the emergency department for right-sided ear pain.  I am concerned that this is likely otitis media.  No obvious signs of malignant otitis externa.  Patient has been afebrile in department with normal vital signs.  I do believe he is safe for outpatient follow-up.  I will write him a prescription for ofloxacin drops.  I will have him follow-up with primary care provider.  Strict turn precautions given.  He is safe for discharge.   Final Clinical  Impression(s) / ED Diagnoses Final diagnoses:  Acute otitis externa of right ear, unspecified type    Rx / DC Orders ED Discharge Orders          Ordered    ofloxacin (FLOXIN) 0.3 % OTIC solution  2 times daily        05/23/21 1740              Honor Loh [place], [place] 05/23/21 1741    Charlynne Pander, MD 05/24/21 1056

## 2021-05-23 NOTE — Discharge Instructions (Signed)
Please use eardrops for the next 7 days.  Please keep ear clean and dry.  Please follow-up to the emergency department if you experience worsening symptoms or do not improve.

## 2021-05-23 NOTE — ED Triage Notes (Signed)
Pt here POV d/t right side ear pain radiating down neck X2 days. Pt states he feels pressure and pain with possible swollen glands .

## 2022-02-11 ENCOUNTER — Ambulatory Visit (HOSPITAL_COMMUNITY): Admit: 2022-02-11 | Payer: Self-pay | Source: Home / Self Care

## 2022-02-11 ENCOUNTER — Ambulatory Visit (HOSPITAL_COMMUNITY)
Admission: EM | Admit: 2022-02-11 | Discharge: 2022-02-11 | Disposition: A | Discharge status: Home / Self Care | Payer: Commercial Managed Care - HMO | Attending: Emergency Medicine | Admitting: Emergency Medicine

## 2022-02-11 ENCOUNTER — Encounter (HOSPITAL_COMMUNITY): Payer: Self-pay | Admitting: Emergency Medicine

## 2022-02-11 DIAGNOSIS — U071 COVID-19: Secondary | ICD-10-CM | POA: Diagnosis present

## 2022-02-11 MED ORDER — ALBUTEROL SULFATE HFA 108 (90 BASE) MCG/ACT IN AERS: 2.0000 | INHALATION_SPRAY | RESPIRATORY_TRACT | 0 refills | Status: AC | PRN

## 2022-02-11 MED ORDER — MOLNUPIRAVIR EUA 200MG CAPSULE: 4.0000 | ORAL_CAPSULE | Freq: Two times a day (BID) | ORAL | 0 refills | Status: DC

## 2022-02-11 NOTE — ED Triage Notes (Signed)
Pt reports chills, fatigue and body aches x 4 days. Took a home test today and it was positive.

## 2022-02-11 NOTE — ED Provider Notes (Signed)
[facility]    CSN: [id number] Arrival date & time: 02/11/22  1713      History   Chief Complaint Chief Complaint  Patient presents with   Chills   Fatigue   Generalized Body Aches    HPI Brady Gibbs is a 41 y.o. male.   Patient presents with chills, subjective fever, nasal congestion, rhinorrhea, mild nonproductive cough and body aches beginning 4 days ago.  Known exposure to COVID, home test positive today.  Decreased appetite but tolerating fluids.  Has attempted use of Tylenol and over-the-counter cold and flu medication which has been ineffective.  History of diabetes, GERD, sleep apnea.  Past Medical History:  Diagnosis Date   Diabetes mellitus without complication (HCC)    DKA (diabetic ketoacidoses) 10/10/2016   GERD (gastroesophageal reflux disease)    OSA on CPAP     Patient Active Problem List   Diagnosis Date Noted   Dysphagia    Abnormal esophagram    DKA (diabetic ketoacidoses) 01/01/2015   Candidiasis    Diabetic ketoacidosis without coma associated with type 2 diabetes mellitus (HCC)    Metabolic acidosis    Insomnia 42/70/6237   Onychomycosis of right great toe 08/10/2014   Body mass index (BMI) of 40.0-44.9 in adult [facility]) 08/10/2014   Hyperlipidemia 09/28/2012   Condyloma acuminata 09/27/2012   Severe obstructive sleep apnea 06/15/2012   Diabetes mellitus, type 2 (HCC) 04/15/2012    Past Surgical History:  Procedure Laterality Date   ESOPHAGOGASTRODUODENOSCOPY N/A 10/15/2016   Procedure: ESOPHAGOGASTRODUODENOSCOPY (EGD);  Surgeon: Meryl Dare, MD;  Location: [facility];  Service: Endoscopy;  Laterality: N/A;   FOOT SURGERY Right  at age 66   cyst removal   HERNIA REPAIR     umbical hernia repair at 41 or 41 years old.       Home Medications    Prior to Admission medications   Medication Sig Start Date End Date Taking? Authorizing Provider  benzonatate (TESSALON) 100 MG capsule Take 1 capsule (100 mg total) by mouth  every 8 (eight) hours. 08/02/18   Hedges, Tinnie Gens, PA-C  doxycycline (VIBRAMYCIN) 100 MG capsule Take 1 capsule (100 mg total) by mouth 2 (two) times daily. 10/03/18   Hedges, Tinnie Gens, PA-C  insulin aspart protamine- aspart (NOVOLOG MIX 70/30) (70-30) 100 UNIT/ML injection Inject 0.35 mLs (35 Units total) into the skin 2 (two) times daily with a meal. Patient not taking: Reported on 05/23/2018 10/16/16   Rolly Salter, MD  metFORMIN (GLUCOPHAGE) 500 MG tablet Take 500 mg by mouth daily as needed (for high blood sugar).     [provider]  ofloxacin (FLOXIN) 0.3 % OTIC solution Place 5 drops into the right ear 2 (two) times daily. 05/23/21   [name], PA-C  omeprazole (PRILOSEC) 40 MG capsule Take 1 capsule (40 mg total) by mouth daily. 08/02/18   Hedges, Tinnie Gens, PA-C  sucralfate (CARAFATE) 1 GM/10ML suspension Take 10 mLs (1 g total) by mouth 4 (four) times daily -  with meals and at bedtime. Patient not taking: Reported on 05/23/2018 10/16/16   Rolly Salter, MD    Family History Family History  Problem Relation Age of Onset   Heart failure Maternal Grandmother    CAD Neg Hx        mother    Social History Social History   Tobacco Use   Smoking status: Every Day    Packs/day: 0.50    Types: Cigarettes   Smokeless tobacco: Never  Vaping Use   Vaping Use: Never used  Substance Use Topics   Alcohol use: No    Alcohol/week: 0.0 standard drinks of alcohol   Drug use: No     Allergies   Patient has no known allergies.   Review of Systems Review of Systems  Constitutional:  Positive for chills and fever. Negative for activity change, appetite change, diaphoresis, fatigue and unexpected weight change.  HENT:  Positive for congestion, rhinorrhea and sore throat. Negative for dental problem, drooling, ear discharge, ear pain, facial swelling, hearing loss, mouth sores, nosebleeds, postnasal drip, sinus pressure, sinus pain, sneezing, tinnitus, trouble swallowing and  voice change.   Respiratory:  Positive for cough. Negative for apnea, choking, chest tightness, shortness of breath, wheezing and stridor.   Cardiovascular: Negative.   Gastrointestinal: Negative.   Musculoskeletal:  Positive for myalgias. Negative for arthralgias, back pain, gait problem, joint swelling, neck pain and neck stiffness.     Physical Exam Triage Vital Signs ED Triage Vitals  Enc Vitals Group     BP 02/11/22 1821 109/73     Pulse Rate 02/11/22 1821 79     Resp 02/11/22 1821 20     Temp 02/11/22 1821 98.2 F (36.8 C)     Temp Source 02/11/22 1821 Oral     SpO2 02/11/22 1821 95 %     Weight --      Height --      Head Circumference --      Peak Flow --      Pain Score 02/11/22 1819 8     Pain Loc --      Pain Edu? --      Excl. in GC? --    No data found.  Updated Vital Signs BP 109/73 (BP Location: Right Arm)   Pulse 79   Temp 98.2 F (36.8 C) (Oral)   Resp 20   SpO2 95%   Visual Acuity Right Eye Distance:   Left Eye Distance:   Bilateral Distance:    Right Eye Near:   Left Eye Near:    Bilateral Near:     Physical Exam Constitutional:      Appearance: Normal appearance. He is ill-appearing.  HENT:     Head: Normocephalic.     Right Ear: Tympanic membrane, ear canal and external ear normal.     Left Ear: Tympanic membrane, ear canal and external ear normal.     Nose: Congestion and rhinorrhea present.     Mouth/Throat:     Mouth: Mucous membranes are moist.     Pharynx: Posterior oropharyngeal erythema present.     Tonsils: No tonsillar exudate. 2+ on the right. 2+ on the left.  Eyes:     Extraocular Movements: Extraocular movements intact.  Cardiovascular:     Rate and Rhythm: Normal rate and regular rhythm.     Pulses: Normal pulses.     Heart sounds: Normal heart sounds.  Pulmonary:     Effort: Pulmonary effort is normal.     Breath sounds: Normal breath sounds.  Musculoskeletal:        General: Normal range of motion.  Skin:     General: Skin is warm and dry.  Neurological:     Mental Status: He is alert and oriented to person, place, and time. Mental status is at baseline.  Psychiatric:        Mood and Affect: Mood normal.        Behavior: Behavior normal.  UC Treatments / Results  Labs (all labs ordered are listed, but only abnormal results are displayed) Labs Reviewed - No data to display  EKG   Radiology No results found.  Procedures Procedures (including critical care time)  Medications Ordered in UC Medications - No data to display  Initial Impression / Assessment and Plan / UC Course  I have reviewed the triage vital signs and the nursing notes.  Pertinent labs & imaging results that were available during my care of the patient were reviewed by me and considered in my medical decision making (see chart for details).  COVID-19  Vital signs are stable, patient is in no signs of distress but appears to be ill. Low suspicion for pneumonia, pneumothorax or bronchitis and therefore will defer imaging. Needing PCR completed for work, retesting done, reviewed quarantine guidelines per CDC recommendations which patient will complete tomorrow, endorses that his job requires a 5 to 7-day quarantine per policy from day of testing, discussed that this does not align with current guidelines and given note for 3 days, prescribed antiviral due to history of diabetes as well as albuterol inhaler as he endorses redness of breath when lying down at night. May use additional over-the-counter medications as needed for supportive care.  May follow-up with urgent care as needed if symptoms persist or worsen.  Final Clinical Impressions(s) / UC Diagnoses   Final diagnoses:  None   Discharge Instructions   None    ED Prescriptions   None    PDMP not reviewed this encounter.   Hans Eden, NP 02/11/22 1842

## 2022-02-11 NOTE — Discharge Instructions (Signed)
We will contact you if your COVID test is positive.  If your test is positive you will need to quarantine for 7 days from the start of your symptoms meaning that after tomorrow you may return to day-to-day activities, if still having symptoms you will need to wear a mask for an additional 5 days.  As far as work please follow your jobs guidelines for quarantine.   Begin use of antiviral treatment every morning and every evening for 5 days, ideally you will see improvement day by day once beginning use, ideally this medicine reduces the amount of virus within your body which will minimize your symptoms and timeline of being sick  Your breathing you may take albuterol, take 2 puffs of inhaler every 4-6 hours as needed for wheezing and shortness of breath,     You can take Tylenol and/or Ibuprofen as needed for fever reduction and pain relief.   For cough: honey 1/2 to 1 teaspoon (you can dilute the honey in water or another fluid).  You can also use guaifenesin and dextromethorphan for cough. You can use a humidifier for chest congestion and cough.  If you don't have a humidifier, you can sit in the bathroom with the hot shower running.      For sore throat: try warm salt water gargles, cepacol lozenges, throat spray, warm tea or water with lemon/honey, popsicles or ice, or OTC cold relief medicine for throat discomfort.   For congestion: take a daily anti-histamine like Zyrtec, Claritin, and a oral decongestant, such as pseudoephedrine.  You can also use Flonase 1-2 sprays in each nostril daily.   It is important to stay hydrated: drink plenty of fluids (water, gatorade/powerade/pedialyte, juices, or teas) to keep your throat moisturized and help further relieve irritation/discomfort.

## 2022-02-12 LAB — SARS CORONAVIRUS 2 (TAT 6-24 HRS): SARS Coronavirus 2: POSITIVE — AB

## 2022-02-13 ENCOUNTER — Telehealth (HOSPITAL_COMMUNITY): Payer: Self-pay

## 2022-02-13 MED ORDER — MOLNUPIRAVIR EUA 200MG CAPSULE: 4.0000 | ORAL_CAPSULE | Freq: Two times a day (BID) | ORAL | 0 refills | Status: AC

## 2022-02-19 ENCOUNTER — Ambulatory Visit (HOSPITAL_COMMUNITY)
Admission: RE | Admit: 2022-02-19 | Discharge: 2022-02-19 | Disposition: A | Discharge status: Home / Self Care | Payer: Commercial Managed Care - HMO | Source: Ambulatory Visit | Attending: Emergency Medicine | Admitting: Emergency Medicine

## 2022-02-19 VITALS — BP 136/92 | HR 72 | Temp 98.5°F | Resp 18

## 2022-02-19 DIAGNOSIS — Z1152 Encounter for screening for COVID-19: Secondary | ICD-10-CM | POA: Insufficient documentation

## 2022-02-19 DIAGNOSIS — Z7984 Long term (current) use of oral hypoglycemic drugs: Secondary | ICD-10-CM | POA: Insufficient documentation

## 2022-02-19 DIAGNOSIS — Z79899 Other long term (current) drug therapy: Secondary | ICD-10-CM | POA: Insufficient documentation

## 2022-02-19 DIAGNOSIS — M791 Myalgia, unspecified site: Secondary | ICD-10-CM | POA: Diagnosis not present

## 2022-02-19 DIAGNOSIS — J0100 Acute maxillary sinusitis, unspecified: Secondary | ICD-10-CM

## 2022-02-19 DIAGNOSIS — R059 Cough, unspecified: Secondary | ICD-10-CM | POA: Diagnosis not present

## 2022-02-19 DIAGNOSIS — R0602 Shortness of breath: Secondary | ICD-10-CM

## 2022-02-19 DIAGNOSIS — E1110 Type 2 diabetes mellitus with ketoacidosis without coma: Secondary | ICD-10-CM | POA: Insufficient documentation

## 2022-02-19 MED ORDER — AMOXICILLIN-POT CLAVULANATE 875-125 MG PO TABS: 1.0000 | ORAL_TABLET | Freq: Two times a day (BID) | ORAL | 0 refills | Status: AC

## 2022-02-19 NOTE — ED Triage Notes (Signed)
Pt was diagnosis with covid on 02/11/2022. Pt reports not feeling any better. Pt reports SOB, fatigue and body aches. Pt would like another covid test for work.

## 2022-02-19 NOTE — Discharge Instructions (Addendum)
COVID test was performed per your request, the test results take a maximum of 24 hours to get the results back.  If they are positive we will give you a call and let you know, can view these test results on MyChart.  As discussed, there is a possibility that this test result will still come back positive.   Augmentin has been sent to the pharmacy, take this medication 2 times daily for the next 7 days.  I suggest that you take this medication with food on your stomach to prevent any stomach upset.   Please pick up albuterol inhaler pharmacy, begin using this every 6 hours while you are awake over the next 2 days.   As discussed, use monitor your symptoms while you are at home, he began having worsening short of breath, chest pain, lightheadedness, palpitations, or any new/worsening symptoms please go to the nearest emergency department.

## 2022-02-19 NOTE — ED Provider Notes (Signed)
[place]    CSN: [id number] Arrival date & time: 02/19/22  1805      History   Chief Complaint Chief Complaint  Patient presents with   Follow-up    Entered by patient    HPI Brady Gibbs is a 41 y.o. male.  Patient presents for follow-up.  Patient was seen on 02/11/2022 and was diagnosed with COVID-19 after receiving a positive test result.  Patient reports that he is having shortness of breath upon exertion.  He states that he was trying to go for a walk this morning when he began experiencing shortness of breath. Patient reports he is not feeling better and is fatigued.  Patient reports productive cough . Patient reports chest soreness.  Patient denies any chest tightness.  Patient denies fever currently.  Patient the last dose of molnupiravir today.  History of sleep apnea.  History of diabetes mellitus.  Denies history of pneumonia.   HPI  Past Medical History:  Diagnosis Date   Diabetes mellitus without complication ([place])    DKA (diabetic ketoacidoses) 10/10/2016   GERD (gastroesophageal reflux disease)    OSA on CPAP     Patient Active Problem List   Diagnosis Date Noted   Dysphagia    Abnormal esophagram    DKA (diabetic ketoacidoses) 01/01/2015   Candidiasis    Diabetic ketoacidosis without coma associated with type 2 diabetes mellitus ([place])    Metabolic acidosis    Insomnia 08/10/2014   Onychomycosis of right great toe 08/10/2014   Body mass index (BMI) of 40.0-44.9 in adult [facility]) 08/10/2014   Hyperlipidemia 09/28/2012   Condyloma acuminata 09/27/2012   Severe obstructive sleep apnea 06/15/2012   Diabetes mellitus, type 2 ([place]) 04/15/2012    Past Surgical History:  Procedure Laterality Date   ESOPHAGOGASTRODUODENOSCOPY N/A 10/15/2016   Procedure: ESOPHAGOGASTRODUODENOSCOPY (EGD);  Surgeon: Ladene Artist, MD;  Location: [facility] ENDOSCOPY;  Service: Endoscopy;  Laterality: N/A;   FOOT SURGERY Right  at age 20   cyst removal   HERNIA REPAIR      umbical hernia repair at 69 or 41 years old.       Home Medications    Prior to Admission medications   Medication Sig Start Date End Date Taking? Authorizing Provider  amoxicillin-clavulanate (AUGMENTIN) 875-125 MG tablet Take 1 tablet by mouth every 12 (twelve) hours. 02/19/22  Yes Flossie Dibble, NP  albuterol (VENTOLIN HFA) 108 (90 Base) MCG/ACT inhaler Inhale 2 puffs into the lungs every 4 (four) hours as needed for wheezing or shortness of breath. 02/11/22   White, Leitha Schuller, NP  metFORMIN (GLUCOPHAGE) 500 MG tablet Take 500 mg by mouth daily as needed (for high blood sugar).     [provider]  ofloxacin (FLOXIN) 0.3 % OTIC solution Place 5 drops into the right ear 2 (two) times daily. 05/23/21   Hendricks Limes, PA-C  omeprazole (PRILOSEC) 40 MG capsule Take 1 capsule (40 mg total) by mouth daily. 08/02/18   Okey Regal, PA-C    Family History Family History  Problem Relation Age of Onset   Heart failure Maternal Grandmother    CAD Neg Hx        mother    Social History Social History   Tobacco Use   Smoking status: Every Day    Packs/day: 0.50    Types: Cigarettes   Smokeless tobacco: Never  Vaping Use   Vaping Use: Never used  Substance Use Topics   Alcohol use: No  Alcohol/week: 0.0 standard drinks of alcohol   Drug use: No     Allergies   Patient has no known allergies.   Review of Systems Review of Systems  Constitutional:  Positive for activity change and fatigue. Negative for appetite change, chills, diaphoresis and fever.  HENT:  Positive for congestion, rhinorrhea, sinus pressure and sinus pain. Negative for ear pain, postnasal drip, sore throat and trouble swallowing.   Respiratory:  Positive for cough and shortness of breath. Negative for chest tightness and wheezing.   Cardiovascular:  Negative for palpitations.  Gastrointestinal:  Negative for abdominal pain, constipation, diarrhea, nausea and vomiting.  Endocrine: Negative.    Musculoskeletal:  Positive for myalgias.  Neurological:  Negative for dizziness and light-headedness.     Physical Exam Triage Vital Signs ED Triage Vitals [02/19/22 1842]  Enc Vitals Group     BP (!) 136/92     Pulse Rate 72     Resp 18     Temp 98.5 F (36.9 C)     Temp Source Oral     SpO2 98 %     Weight      Height      Head Circumference      Peak Flow      Pain Score      Pain Loc      Pain Edu?      Excl. in [place]?    No data found.  Updated Vital Signs BP (!) 136/92 (BP Location: Left Arm)   Pulse 72   Temp 98.5 F (36.9 C) (Oral)   Resp 18   SpO2 98%  Physical Exam Vitals and nursing note reviewed.  HENT:     Right Ear: Hearing, tympanic membrane, ear canal and external ear normal.     Left Ear: Hearing, tympanic membrane, ear canal and external ear normal.     Nose: Congestion and rhinorrhea present. Rhinorrhea is clear.     Right Turbinates: Swollen. Not enlarged or pale.     Left Turbinates: Swollen. Not enlarged or pale.     Right Sinus: Maxillary sinus tenderness present. No frontal sinus tenderness.     Left Sinus: Maxillary sinus tenderness present. No frontal sinus tenderness.     Mouth/Throat:     Mouth: Mucous membranes are moist.     Pharynx: Oropharynx is clear. No pharyngeal swelling, oropharyngeal exudate, posterior oropharyngeal erythema or uvula swelling.     Tonsils: No tonsillar exudate or tonsillar abscesses. 0 on the right. 0 on the left.  Cardiovascular:     Rate and Rhythm: Normal rate and regular rhythm.     Heart sounds: Normal heart sounds, S1 normal and S2 normal.  Pulmonary:     Effort: Pulmonary effort is normal. No tachypnea.     Breath sounds: Normal air entry. Examination of the right-middle field reveals decreased breath sounds. Examination of the left-middle field reveals decreased breath sounds. Examination of the right-lower field reveals decreased breath sounds. Examination of the left-lower field reveals decreased  breath sounds. Decreased breath sounds present. No wheezing, rhonchi or rales.  Lymphadenopathy:     Cervical: No cervical adenopathy.  Neurological:     Mental Status: He is alert.  Psychiatric:        Behavior: Behavior is cooperative.      UC Treatments / Results  Labs (all labs ordered are listed, but only abnormal results are displayed) Labs Reviewed  SARS CORONAVIRUS 2 (TAT 6-24 HRS)    EKG   Radiology  No results found.  Procedures Procedures (including critical care time)  Medications Ordered in UC Medications - No data to display  Initial Impression / Assessment and Plan / UC Course  I have reviewed the triage vital signs and the nursing notes.  Pertinent labs & imaging results that were available during my care of the patient were reviewed by me and considered in my medical decision making (see chart for details).     Patient was evaluated for follow-up and diagnosed with acute maxillary sinusitis.  Due to symptomology and assessment, patient was made aware that he needs to use the albuterol inhaler that was prescribed.  He reports he was unable to pick up that prescription but will be able to tomorrow.  Patient was instructed and directed to use this medication consistently at least the next 2 days to aid with improvement of airflow in his lungs.  Augmentin sent to the pharmacy.  Patient made aware of possible side effects with the prescribed medications.  Patient request COVID testing.  He was educated that [place] test could come back positive up to 3 months.  Patient stated he still wanted COVID testing due to his job.  Patient was made aware about CDC guidelines for going back to work.  Patient was made aware of red flag symptoms that would warrant an emergency department visit. Patient verbalized understanding of instructions.  Final Clinical Impressions(s) / UC Diagnoses   Final diagnoses:  Acute non-recurrent maxillary sinusitis  Shortness of breath      Discharge Instructions      COVID test was performed per your request, the test results take a maximum of 24 hours to get the results back.  If they are positive we will give you a call and let you know, can view these test results on MyChart.  As discussed, there is a possibility that this test result will still come back positive.   Augmentin has been sent to the pharmacy, take this medication 2 times daily for the next 7 days.  I suggest that you take this medication with food on your stomach to prevent any stomach upset.   Please pick up albuterol inhaler pharmacy, begin using this every 6 hours while you are awake over the next 2 days.   As discussed, use monitor your symptoms while you are at home, he began having worsening short of breath, chest pain, lightheadedness, palpitations, or any new/worsening symptoms please go to the nearest emergency department.     ED Prescriptions     Medication Sig Dispense Auth. Provider   amoxicillin-clavulanate (AUGMENTIN) 875-125 MG tablet Take 1 tablet by mouth every 12 (twelve) hours. 14 tablet Flossie Dibble, NP      PDMP not reviewed this encounter.   Flossie Dibble, NP 02/19/22 2014

## 2022-02-20 LAB — SARS CORONAVIRUS 2 (TAT 6-24 HRS): SARS Coronavirus 2: POSITIVE — AB

## 2023-04-03 ENCOUNTER — Emergency Department (HOSPITAL_BASED_OUTPATIENT_CLINIC_OR_DEPARTMENT_OTHER): Payer: 59 | Admitting: Radiology

## 2023-04-03 ENCOUNTER — Other Ambulatory Visit: Payer: Self-pay

## 2023-04-03 ENCOUNTER — Other Ambulatory Visit (HOSPITAL_BASED_OUTPATIENT_CLINIC_OR_DEPARTMENT_OTHER): Payer: Self-pay

## 2023-04-03 ENCOUNTER — Encounter (HOSPITAL_BASED_OUTPATIENT_CLINIC_OR_DEPARTMENT_OTHER): Payer: Self-pay

## 2023-04-03 ENCOUNTER — Emergency Department (HOSPITAL_BASED_OUTPATIENT_CLINIC_OR_DEPARTMENT_OTHER)
Admission: EM | Admit: 2023-04-03 | Discharge: 2023-04-03 | Disposition: A | Discharge status: Home / Self Care | Payer: 59 | Attending: Emergency Medicine | Admitting: Emergency Medicine

## 2023-04-03 DIAGNOSIS — Z20822 Contact with and (suspected) exposure to covid-19: Secondary | ICD-10-CM | POA: Insufficient documentation

## 2023-04-03 DIAGNOSIS — Z7984 Long term (current) use of oral hypoglycemic drugs: Secondary | ICD-10-CM | POA: Diagnosis not present

## 2023-04-03 DIAGNOSIS — R197 Diarrhea, unspecified: Secondary | ICD-10-CM | POA: Insufficient documentation

## 2023-04-03 DIAGNOSIS — J101 Influenza due to other identified influenza virus with other respiratory manifestations: Secondary | ICD-10-CM | POA: Insufficient documentation

## 2023-04-03 DIAGNOSIS — E119 Type 2 diabetes mellitus without complications: Secondary | ICD-10-CM | POA: Insufficient documentation

## 2023-04-03 DIAGNOSIS — R0789 Other chest pain: Secondary | ICD-10-CM | POA: Diagnosis not present

## 2023-04-03 DIAGNOSIS — R059 Cough, unspecified: Secondary | ICD-10-CM | POA: Diagnosis not present

## 2023-04-03 DIAGNOSIS — R079 Chest pain, unspecified: Secondary | ICD-10-CM | POA: Diagnosis not present

## 2023-04-03 DIAGNOSIS — J09X2 Influenza due to identified novel influenza A virus with other respiratory manifestations: Secondary | ICD-10-CM | POA: Diagnosis not present

## 2023-04-03 LAB — CBC WITH DIFFERENTIAL/PLATELET
Abs Immature Granulocytes: 0.01 10*3/uL (ref 0.00–0.07)
Basophils Absolute: 0 10*3/uL (ref 0.0–0.1)
Basophils Relative: 1 %
Eosinophils Absolute: 0 10*3/uL (ref 0.0–0.5)
Eosinophils Relative: 0 %
HCT: 48.5 % (ref 39.0–52.0)
Hemoglobin: 15.7 g/dL (ref 13.0–17.0)
Immature Granulocytes: 0 %
Lymphocytes Relative: 45 %
Lymphs Abs: 1.4 10*3/uL (ref 0.7–4.0)
MCH: 27.6 pg (ref 26.0–34.0)
MCHC: 32.4 g/dL (ref 30.0–36.0)
MCV: 85.4 fL (ref 80.0–100.0)
Monocytes Absolute: 0.3 10*3/uL (ref 0.1–1.0)
Monocytes Relative: 11 %
Neutro Abs: 1.3 10*3/uL — ABNORMAL LOW (ref 1.7–7.7)
Neutrophils Relative %: 43 %
Platelets: 184 10*3/uL (ref 150–400)
RBC: 5.68 MIL/uL (ref 4.22–5.81)
RDW: 13.2 % (ref 11.5–15.5)
WBC: 3.1 10*3/uL — ABNORMAL LOW (ref 4.0–10.5)
nRBC: 0 % (ref 0.0–0.2)

## 2023-04-03 LAB — LIPASE, BLOOD: Lipase: 68 U/L — ABNORMAL HIGH (ref 11–51)

## 2023-04-03 LAB — COMPREHENSIVE METABOLIC PANEL
ALT: 42 U/L (ref 0–44)
AST: 59 U/L — ABNORMAL HIGH (ref 15–41)
Albumin: 4.1 g/dL (ref 3.5–5.0)
Alkaline Phosphatase: 47 U/L (ref 38–126)
Anion gap: 8 (ref 5–15)
BUN: 17 mg/dL (ref 6–20)
CO2: 25 mmol/L (ref 22–32)
Calcium: 8.4 mg/dL — ABNORMAL LOW (ref 8.9–10.3)
Chloride: 102 mmol/L (ref 98–111)
Creatinine, Ser: 1.01 mg/dL (ref 0.61–1.24)
GFR, Estimated: >60 mL/min (ref >60)
Glucose, Bld: 109 mg/dL — ABNORMAL HIGH (ref 70–99)
Potassium: 3.7 mmol/L (ref 3.5–5.1)
Sodium: 135 mmol/L (ref 135–145)
Total Bilirubin: 0.4 mg/dL (ref <1.2)
Total Protein: 6.8 g/dL (ref 6.5–8.1)

## 2023-04-03 LAB — RESP PANEL BY RT-PCR (RSV, FLU A&B, COVID)  RVPGX2
Influenza A by PCR: POSITIVE — AB
Influenza B by PCR: NEGATIVE
Resp Syncytial Virus by PCR: NEGATIVE
SARS Coronavirus 2 by RT PCR: NEGATIVE

## 2023-04-03 LAB — CBG MONITORING, ED: Glucose-Capillary: 105 mg/dL — ABNORMAL HIGH (ref 70–99)

## 2023-04-03 LAB — TROPONIN I (HIGH SENSITIVITY): Troponin I (High Sensitivity): 6 ng/L (ref <18)

## 2023-04-03 MED ORDER — ONDANSETRON HCL 4 MG/2ML IJ SOLN
4.0000 mg | Freq: Once | INTRAMUSCULAR | Status: AC
  Administered 2023-04-03: 4 mg via INTRAVENOUS
  Filled 2023-04-03: qty 2

## 2023-04-03 MED ORDER — ONDANSETRON HCL 4 MG PO TABS
4.0000 mg | ORAL_TABLET | Freq: Four times a day (QID) | ORAL | 0 refills | Status: AC
  Filled 2023-04-03: qty 12, 3d supply, fill #0

## 2023-04-03 MED ORDER — ALBUTEROL SULFATE HFA 108 (90 BASE) MCG/ACT IN AERS: 1.0000 | INHALATION_SPRAY | Freq: Once | RESPIRATORY_TRACT | Status: DC

## 2023-04-03 NOTE — ED Triage Notes (Signed)
Pt POV from home c/o center chest tightness w/ NVD since Tues morning. Pt last ate this morning around 3am, followed by diarrhea. Pt states unable to take deep breaths w/ CP. NAD during triage, skin warm/dry. CAOx4

## 2023-04-03 NOTE — Discharge Instructions (Addendum)
Today you were seen for influenza A.  Please pick up your prescription and take as prescribed.  You may alternate taking Tylenol or Motrin for pain as needed.  Please do not take Motrin for greater than 5 days and arises may cause rebound headaches.  Thank you for letting us treat you today. After reviewing your labs and imaging, I feel you are safe to go home. Please follow up with your PCP in the next several days and provide them with your records from this visit. Return to the Emergency Room if pain becomes severe or symptoms worsen.

## 2023-04-03 NOTE — ED Provider Notes (Signed)
[name] Provider Note   CSN: [id number] Arrival date & time: 04/03/23  8144     History  Chief Complaint  Patient presents with   Chest Pain    Brady Gibbs is a 42 y.o. male past medical history significant for diabetes presents today for 5 days of nausea, vomiting, diarrhea, and upper respiratory symptoms.  Patient endorses congestion, productive cough, mild headache, body aches and chest tightness.  Patient denies chest pain, headache, sore throat, or shortness of breath.  Patient describes the vomit is nonbilious nonbloody and has no blood in stool.  Patient also noted mild intermittent epistaxis.   Chest Pain Associated symptoms: cough, nausea and vomiting        Home Medications Prior to Admission medications   Medication Sig Start Date End Date Taking? Authorizing Provider  ondansetron (ZOFRAN) 4 MG tablet Take 1 tablet (4 mg total) by mouth every 6 (six) hours. 04/03/23  Yes [name], PA-C  albuterol (VENTOLIN HFA) 108 (90 Base) MCG/ACT inhaler Inhale 2 puffs into the lungs every 4 (four) hours as needed for wheezing or shortness of breath. 02/11/22   [name], NP  amoxicillin-clavulanate (AUGMENTIN) 875-125 MG tablet Take 1 tablet by mouth every 12 (twelve) hours. 02/19/22   [name], NP  metFORMIN (GLUCOPHAGE) 500 MG tablet Take 500 mg by mouth daily as needed (for high blood sugar).     [provider]  ofloxacin (FLOXIN) 0.3 % OTIC solution Place 5 drops into the right ear 2 (two) times daily. 05/23/21   [name], PA-C  omeprazole (PRILOSEC) 40 MG capsule Take 1 capsule (40 mg total) by mouth daily. 08/02/18   [name], PA-C      Allergies    Patient has no known allergies.    Review of Systems   Review of Systems  Constitutional:  Positive for chills.  HENT:  Positive for congestion.   Respiratory:  Positive for cough and chest tightness.   Cardiovascular:  Positive  for chest pain.  Gastrointestinal:  Positive for diarrhea, nausea and vomiting.  Musculoskeletal:  Positive for myalgias.    Physical Exam Updated Vital Signs BP 112/86 (BP Location: Right Arm)   Pulse 80   Temp 98.5 F (36.9 C) (Oral)   Resp 17   Ht 6\' 1"  (1.854 m)   Wt 108 kg   SpO2 95%   BMI 31.40 kg/m  Physical Exam Vitals and nursing note reviewed.  Constitutional:      General: He is not in acute distress.    Appearance: He is well-developed. He is ill-appearing.  HENT:     Head: Normocephalic and atraumatic.     Right Ear: External ear normal.     Left Ear: External ear normal.     Nose: Congestion present.     Right Nostril: No epistaxis or septal hematoma.     Left Nostril: No epistaxis or septal hematoma.     Mouth/Throat:     Mouth: Mucous membranes are moist.     Pharynx: Uvula midline. Posterior oropharyngeal erythema and postnasal drip present. No uvula swelling.     Tonsils: No tonsillar exudate or tonsillar abscesses.     Comments: No signs of epiglottitis, PTA, Ludwig angina. Eyes:     Conjunctiva/sclera: Conjunctivae normal.     Pupils: Pupils are equal, round, and reactive to light.  Cardiovascular:     Rate and Rhythm: Normal rate and regular rhythm.  Heart sounds: Normal heart sounds. No murmur heard. Pulmonary:     Effort: Pulmonary effort is normal. No respiratory distress.     Breath sounds: Wheezing present.  Abdominal:     Palpations: Abdomen is soft.     Tenderness: There is no abdominal tenderness.  Musculoskeletal:        General: No swelling.     Cervical back: Neck supple.  Skin:    General: Skin is warm and dry.     Capillary Refill: Capillary refill takes less than 2 seconds.  Neurological:     General: No focal deficit present.     Mental Status: He is alert.  Psychiatric:        Mood and Affect: Mood normal.     ED Results / Procedures / Treatments   Labs (all labs ordered are listed, but only abnormal results are  displayed) Labs Reviewed  RESP PANEL BY RT-PCR (RSV, FLU A&B, COVID)  RVPGX2 - Abnormal; Notable for the following components:      Result Value   Influenza A by PCR POSITIVE (*)    All other components within normal limits  COMPREHENSIVE METABOLIC PANEL - Abnormal; Notable for the following components:   Glucose, Bld 109 (*)    Calcium 8.4 (*)    AST 59 (*)    All other components within normal limits  LIPASE, BLOOD - Abnormal; Notable for the following components:   Lipase 68 (*)    All other components within normal limits  CBC WITH DIFFERENTIAL/PLATELET - Abnormal; Notable for the following components:   WBC 3.1 (*)    Neutro Abs 1.3 (*)    All other components within normal limits  CBG MONITORING, ED - Abnormal; Notable for the following components:   Glucose-Capillary 105 (*)    All other components within normal limits  URINALYSIS, ROUTINE W REFLEX MICROSCOPIC  TROPONIN I (HIGH SENSITIVITY)  TROPONIN I (HIGH SENSITIVITY)    EKG None  Radiology DG Chest 2 View  Result Date: 04/03/2023 CLINICAL DATA:  Chest pain. EXAM: CHEST - 2 VIEW COMPARISON:  05/23/2018 FINDINGS: No worrisome lytic or sclerotic osseous abnormality. The cardiopericardial silhouette is within normal limits for size. No acute bony abnormality. Telemetry leads overlie the chest. IMPRESSION: No active cardiopulmonary disease. Electronically Signed   By: Kennith Center M.D.   On: 04/03/2023 11:32    Procedures Procedures    Medications Ordered in ED Medications  albuterol (VENTOLIN HFA) 108 (90 Base) MCG/ACT inhaler 1-2 puff (has no administration in time range)  ondansetron (ZOFRAN) injection 4 mg (4 mg Intravenous Given 04/03/23 1157)    ED Course/ Medical Decision Making/ A&P                                 Medical Decision Making Amount and/or Complexity of Data Reviewed Labs: ordered. Radiology: ordered.  Risk Prescription drug management.   This patient presents to the ED with chief  complaint(s) of chest pain with nausea, vomiting, diarrhea with pertinent past medical history of diabetes which further complicates the presenting complaint. The complaint involves an extensive differential diagnosis and also carries with it a high risk of complications and morbidity.    The differential diagnosis includes DKA, gastroenteritis, NSTEMI, STEMI  Additional history obtained: Records reviewed Primary Care Documents  ED Course and Reassessment:   Independent labs interpretation:  The following labs were independently interpreted:  CBC: Mild leukopenia at 3.1 CMP:  Mildly decreased calcium, mildly elevated AST Lipase: 68 Troponin: 6 EKG: Sinus, borderline right axis deviation, ST elevation likely normal early repol CBG: 105 Respiratory panel: Positive for influenza A  Independent visualization of imaging: - I independently visualized the following imaging with scope of interpretation limited to determining acute life threatening conditions related to emergency care: Chest x-ray, which revealed no active cardiopulmonary disease  Consultation: - Consulted or discussed management/test interpretation w/ external professional: None  Consideration for admission or further workup: Considered for admission or further workup however patient's vital signs, physical exam, labs, and imaging of all been reassuring.  Patient will be treated symptomatically as he tested positive for influenza A.  Patient should follow-up with PCP for further evaluation and treatment if symptoms persist.        Final Clinical Impression(s) / ED Diagnoses Final diagnoses:  Influenza A    Rx / DC Orders ED Discharge Orders          Ordered    ondansetron (ZOFRAN) 4 MG tablet  Every 6 hours        04/03/23 1211              [name], PA-C 04/03/23 1212    Alvira Monday, MD 04/05/23 1129

## 2023-04-13 ENCOUNTER — Other Ambulatory Visit (HOSPITAL_BASED_OUTPATIENT_CLINIC_OR_DEPARTMENT_OTHER): Payer: Self-pay
